# Patient Record
Sex: Female | Born: 1975 | Hispanic: No | Marital: Married | Smoking: Never smoker
Health system: Southern US, Community
[De-identification: ages and names within clinical notes are randomized; demographics above are authoritative.]

## PROBLEM LIST (undated history)

## (undated) DIAGNOSIS — R42 Dizziness and giddiness: Secondary | ICD-10-CM

## (undated) DIAGNOSIS — M7989 Other specified soft tissue disorders: Secondary | ICD-10-CM

## (undated) DIAGNOSIS — K219 Gastro-esophageal reflux disease without esophagitis: Secondary | ICD-10-CM

## (undated) DIAGNOSIS — D649 Anemia, unspecified: Secondary | ICD-10-CM

## (undated) DIAGNOSIS — M25529 Pain in unspecified elbow: Secondary | ICD-10-CM

## (undated) HISTORY — PX: BREAST SURGERY: SHX581

## (undated) HISTORY — PX: WISDOM TOOTH EXTRACTION: SHX21

---

## 2013-07-01 ENCOUNTER — Other Ambulatory Visit: Payer: Self-pay | Admitting: Cardiovascular Disease

## 2013-07-01 ENCOUNTER — Ambulatory Visit
Admission: RE | Admit: 2013-07-01 | Discharge: 2013-07-01 | Disposition: A | Discharge status: Home / Self Care | Payer: No Typology Code available for payment source | Source: Ambulatory Visit | Attending: Cardiovascular Disease | Admitting: Cardiovascular Disease

## 2013-07-01 DIAGNOSIS — J40 Bronchitis, not specified as acute or chronic: Secondary | ICD-10-CM

## 2015-08-20 ENCOUNTER — Encounter (HOSPITAL_COMMUNITY): Payer: Self-pay

## 2015-08-20 ENCOUNTER — Emergency Department (HOSPITAL_COMMUNITY): Payer: Self-pay

## 2015-08-20 ENCOUNTER — Emergency Department (HOSPITAL_COMMUNITY)
Admission: EM | Admit: 2015-08-20 | Discharge: 2015-08-21 | Disposition: A | Discharge status: Home / Self Care | Payer: Self-pay | Attending: Emergency Medicine | Admitting: Emergency Medicine

## 2015-08-20 DIAGNOSIS — Z792 Long term (current) use of antibiotics: Secondary | ICD-10-CM | POA: Insufficient documentation

## 2015-08-20 DIAGNOSIS — N949 Unspecified condition associated with female genital organs and menstrual cycle: Secondary | ICD-10-CM

## 2015-08-20 DIAGNOSIS — Z3202 Encounter for pregnancy test, result negative: Secondary | ICD-10-CM | POA: Insufficient documentation

## 2015-08-20 DIAGNOSIS — N838 Other noninflammatory disorders of ovary, fallopian tube and broad ligament: Secondary | ICD-10-CM | POA: Insufficient documentation

## 2015-08-20 DIAGNOSIS — D259 Leiomyoma of uterus, unspecified: Secondary | ICD-10-CM | POA: Insufficient documentation

## 2015-08-20 LAB — CBC WITH DIFFERENTIAL/PLATELET
Basophils Absolute: 0 10*3/uL (ref 0.0–0.1)
Basophils Relative: 0 %
Eosinophils Absolute: 0.2 10*3/uL (ref 0.0–0.7)
Eosinophils Relative: 2 %
HCT: 35.4 % — ABNORMAL LOW (ref 36.0–46.0)
Hemoglobin: 11.2 g/dL — ABNORMAL LOW (ref 12.0–15.0)
Lymphocytes Relative: 31 %
Lymphs Abs: 3.3 10*3/uL (ref 0.7–4.0)
MCH: 22.1 pg — ABNORMAL LOW (ref 26.0–34.0)
MCHC: 31.6 g/dL (ref 30.0–36.0)
MCV: 70 fL — ABNORMAL LOW (ref 78.0–100.0)
Monocytes Absolute: 0.8 10*3/uL (ref 0.1–1.0)
Monocytes Relative: 7 %
Neutro Abs: 6.4 10*3/uL (ref 1.7–7.7)
Neutrophils Relative %: 60 %
Platelets: 364 10*3/uL (ref 150–400)
RBC: 5.06 MIL/uL (ref 3.87–5.11)
RDW: 14.5 % (ref 11.5–15.5)
WBC: 10.7 10*3/uL — ABNORMAL HIGH (ref 4.0–10.5)

## 2015-08-20 LAB — URINE MICROSCOPIC-ADD ON
BACTERIA UA: NONE SEEN
WBC UA: NONE SEEN WBC/hpf (ref 0–5)

## 2015-08-20 LAB — URINALYSIS, ROUTINE W REFLEX MICROSCOPIC
Bilirubin Urine: NEGATIVE
Glucose, UA: NEGATIVE mg/dL
Ketones, ur: NEGATIVE mg/dL
Leukocytes, UA: NEGATIVE
NITRITE: NEGATIVE
PH: 6 (ref 5.0–8.0)
PROTEIN: NEGATIVE mg/dL
Specific Gravity, Urine: 1.006 (ref 1.005–1.030)

## 2015-08-20 LAB — BASIC METABOLIC PANEL
Anion gap: 9 (ref 5–15)
BUN: 8 mg/dL (ref 6–20)
CO2: 24 mmol/L (ref 22–32)
Calcium: 9.5 mg/dL (ref 8.9–10.3)
Chloride: 104 mmol/L (ref 101–111)
Creatinine, Ser: 0.74 mg/dL (ref 0.44–1.00)
GFR calc Af Amer: >60 mL/min (ref >60)
GFR calc non Af Amer: >60 mL/min (ref >60)
Glucose, Bld: 92 mg/dL (ref 65–99)
Potassium: 3.9 mmol/L (ref 3.5–5.1)
Sodium: 137 mmol/L (ref 135–145)

## 2015-08-20 LAB — POC URINE PREG, ED: PREG TEST UR: NEGATIVE

## 2015-08-20 MED ORDER — IOPAMIDOL (ISOVUE-300) INJECTION 61%
100.0000 mL | Freq: Once | INTRAVENOUS | Status: AC | PRN
  Administered 2015-08-20: 100 mL via INTRAVENOUS

## 2015-08-20 MED ORDER — IOHEXOL 300 MG/ML  SOLN
25.0000 mL | Freq: Once | INTRAMUSCULAR | Status: AC | PRN
  Administered 2015-08-20: 25 mL via ORAL

## 2015-08-20 NOTE — ED Notes (Signed)
Pt complains of burning with urination

## 2015-08-20 NOTE — ED Provider Notes (Signed)
CSN: [id number]     Arrival date & time 08/20/15  2019 History   First MD Initiated Contact with Patient 08/20/15 2155     Chief Complaint  Patient presents with  . Abdominal Pain    HPI   40 year old female presents today with abdominal pain. Patient reports several months of increasing abdominal girth, abdominal discomfort located in her lower abdomen and pelvis. She reports over the last 3 weeks she had increasing pain in the pelvis and bladder. Patient was seen by urgent care 2 days ago, was given a prescription of Bactrim for presumed urinary tract infection despite not having any infection present on exam. TIMI persist after 2 days of antibiotics. She describes the pain as sharp with pressure located below the umbilicus down into the pelvis. She denies any vaginal bleeding or discharge; notes irregular cycles most recently on February 25. She denies any fever, chills, nausea, vomiting, upper abdominal pain, changes in bowel habits. Patient notes that she has had increasing urinary frequency, reporting she is unable to hold urine or produce large amounts each time.     History reviewed. No pertinent past medical history. History reviewed. No pertinent past surgical history. History reviewed. No pertinent family history. Social History  Substance Use Topics  . Smoking status: Never Smoker   . Smokeless tobacco: None  . Alcohol Use: No   OB History    No data available     Review of Systems  All other systems reviewed and are negative.   Allergies  Review of patient's allergies indicates no known allergies.  Home Medications   Prior to Admission medications   Medication Sig Start Date End Date Taking? Authorizing Provider  HYDROcodone-acetaminophen (NORCO/VICODIN) 5-325 MG tablet Take 2 tablets by mouth every 4 (four) hours as needed. 08/21/15   Okey Regal, PA-C  sulfamethoxazole-trimethoprim (BACTRIM DS,SEPTRA DS) 800-160 MG tablet Take 1 tablet by mouth 2 (two) times  daily. ABT Start Date 08/18/15 & End Date 08/24/15.   Yes Historical Provider, MD  traMADol (ULTRAM) 50 MG tablet Take 1 tablet (50 mg total) by mouth every 6 (six) hours as needed. 08/21/15   Okey Regal, PA-C   BP 123/87 mmHg  Pulse 65  Temp(Src) 97.7 F (36.5 C) (Oral)  Resp 20  SpO2 100%  LMP 07/17/2015 Physical Exam  Constitutional: She is oriented to person, place, and time. She appears well-developed and well-nourished.  HENT:  Head: Normocephalic and atraumatic.  Eyes: Conjunctivae are normal. Pupils are equal, round, and reactive to light. Right eye exhibits no discharge. Left eye exhibits no discharge. No scleral icterus.  Neck: Normal range of motion. No JVD present. No tracheal deviation present.  Pulmonary/Chest: Effort normal. No stridor.  Abdominal:  Lower abdominal distention noted, significant tenderness to palpation of the lower abdomen and pelvis bilateral  Genitourinary: Vagina normal. No erythema or bleeding in the vagina. No vaginal discharge found.  Gravid uterus with tenderness to palpation  Neurological: She is alert and oriented to person, place, and time. Coordination normal.  Psychiatric: She has a normal mood and affect. Her behavior is normal. Judgment and thought content normal.  Nursing note and vitals reviewed.   ED Course  Procedures (including critical care time) Labs Review Labs Reviewed  WET PREP, GENITAL - Abnormal; Notable for the following:    WBC, Wet Prep HPF POC FEW (*)    All other components within normal limits  URINALYSIS, ROUTINE W REFLEX MICROSCOPIC (NOT AT [facility]) - Abnormal; Notable for the following:  Hgb urine dipstick MODERATE (*)    All other components within normal limits  URINE MICROSCOPIC-ADD ON - Abnormal; Notable for the following:    Squamous Epithelial / LPF 0-5 (*)    All other components within normal limits  CBC WITH DIFFERENTIAL/PLATELET - Abnormal; Notable for the following:    WBC 10.7 (*)    Hemoglobin 11.2  (*)    HCT 35.4 (*)    MCV 70.0 (*)    MCH 22.1 (*)    All other components within normal limits  BASIC METABOLIC PANEL  POC URINE PREG, ED  GC/CHLAMYDIA PROBE AMP ([place]) NOT AT [facility]    Imaging Review Ct Abdomen Pelvis W Contrast  08/21/2015  CLINICAL DATA:  Acute onset of severe lower abdominal pain. Initial encounter. EXAM: CT ABDOMEN AND PELVIS WITH CONTRAST TECHNIQUE: Multidetector CT imaging of the abdomen and pelvis was performed using the standard protocol following bolus administration of intravenous contrast. CONTRAST:  182mL ISOVUE-300 IOPAMIDOL (ISOVUE-300) INJECTION 61% COMPARISON:  None. FINDINGS: The visualized lung bases are clear. The liver and spleen are unremarkable in appearance. The gallbladder is within normal limits. The pancreas and adrenal glands are unremarkable. The kidneys are unremarkable in appearance. There is no evidence of hydronephrosis. No renal or ureteral stones are seen. No perinephric stranding is appreciated. No free fluid is identified. The small bowel is unremarkable in appearance. The stomach is within normal limits. No acute vascular abnormalities are seen. The appendix is normal in caliber, without evidence of appendicitis. The colon is unremarkable in appearance. The bladder is mildly distended and grossly unremarkable. There is a very large 12.1 x 11.4 x 9.5 cm mass noted posteriorly at the lower uterine segment. This is mildly heterogeneous in appearance, without significant associated endometrial fluid. Given its size and the lack of significant additional findings, this most likely reflects a large exophytic fibroid, though a cervical mass cannot be entirely excluded given the presence of an 8 mm node posteriorly adjacent to the distal sigmoid colon. A 3.9 cm left adnexal cyst is likely physiologic in nature, though pelvic ultrasound could be considered for further evaluation. The right ovary is not well seen. Scattered nabothian cysts are noted at  the cervix. No inguinal lymphadenopathy is seen. No acute osseous abnormalities are identified. IMPRESSION: 1. Very large 12.1 x 11.4 x 9.5 cm mass noted posteriorly at the lower uterine segment. No significant associated endometrial fluid seen. Given its size and the lack of significant additional findings, this most likely reflects a large exophytic fibroid. It may correspond to the patient's symptoms. A cervical mass cannot be entirely excluded, given the presence of an 8 mm node posteriorly adjacent to the distal sigmoid colon. Would correlate with Pap smear and lab results, to help exclude malignancy. 2. 3.9 cm left adnexal cyst is likely physiologic, though pelvic ultrasound could be considered for further evaluation, when and as deemed clinically appropriate. Electronically Signed   By: Garald Balding M.D.   On: 08/21/2015 00:14   I have personally reviewed and evaluated these images and lab results as part of my medical decision-making.   EKG Interpretation None      MDM   Final diagnoses:  Uterine leiomyoma, unspecified location  Adnexal cyst    Labs: Wet prep, CBC, CMP, point-of-care break, urinalysis- no significant findings  Imaging: CT abdomen and pelvis  Consults:  Therapeutics: Hydrocodone  Discharge Meds:   Assessment/Plan: 40 year old female presents today with uterine fibroid, this is significant in size likely source  for patient's discomfort. Patient also noted to have a left adnexal cyst which is likely physiologic. Patient has no infectious signs or symptoms on exam, low suspicion for any pelvic inflammatory disease, infectious etiology. Patient will be given pain medication, encouraged to follow up closely with the [facility] and OB/GYN for further evaluation and management. She is instructed to make contact first thing Monday morning and schedule first available follow-up visit. If any new or worsening signs or symptoms present patient is instructed to return  immediately to the emergency room for further evaluation and management. The patient and husband verbalized understanding and agreement to today's plan had no further questions or concerns at time of discharge          Okey Regal, PA-C 08/21/15 [place], DO 08/21/15 2207

## 2015-08-20 NOTE — ED Notes (Signed)
Pt complains of severe lower abd pain that started several days ago, she received an antibiotic for a UTI on the 29th and isn't getting any relief, no vomiting or diarrhea, she states she is having a hard time holding her urine

## 2015-08-20 NOTE — ED Notes (Signed)
Patient transported to CT 

## 2015-08-21 LAB — WET PREP, GENITAL
Clue Cells Wet Prep HPF POC: NONE SEEN
Sperm: NONE SEEN
Trich, Wet Prep: NONE SEEN
Yeast Wet Prep HPF POC: NONE SEEN

## 2015-08-21 MED ORDER — HYDROCODONE-ACETAMINOPHEN 5-325 MG PO TABS: 2.0000 | ORAL_TABLET | ORAL | Status: DC | PRN

## 2015-08-21 MED ORDER — HYDROCODONE-ACETAMINOPHEN 5-325 MG PO TABS
1.0000 | ORAL_TABLET | Freq: Once | ORAL | Status: AC
  Administered 2015-08-21: 1 via ORAL
  Filled 2015-08-21: qty 1

## 2015-08-21 MED ORDER — TRAMADOL HCL 50 MG PO TABS: 50.0000 mg | ORAL_TABLET | Freq: Four times a day (QID) | ORAL | Status: DC | PRN

## 2015-08-21 NOTE — Discharge Instructions (Signed)
Please follow-up with OB/GYN first thing Monday morning and schedule follow-up evaluation. If any new or worsening signs or symptoms present please return immediately for further evaluation. Please use pain medication only as needed for pain. Ultram may be used for nonsevere abdominal discomfort, if pain is not relieved with Ultram, hydrocodone may be used.

## 2015-08-23 LAB — GC/CHLAMYDIA PROBE AMP (~~LOC~~) NOT AT ARMC
Chlamydia: NEGATIVE
Neisseria Gonorrhea: NEGATIVE

## 2015-08-26 ENCOUNTER — Inpatient Hospital Stay (HOSPITAL_COMMUNITY)
Admission: AD | Admit: 2015-08-26 | Discharge: 2015-08-26 | Disposition: A | Discharge status: Home / Self Care | Payer: Self-pay | Source: Ambulatory Visit | Attending: Obstetrics & Gynecology | Admitting: Obstetrics & Gynecology

## 2015-08-26 ENCOUNTER — Inpatient Hospital Stay (HOSPITAL_COMMUNITY): Payer: Self-pay

## 2015-08-26 ENCOUNTER — Encounter (HOSPITAL_COMMUNITY): Payer: Self-pay

## 2015-08-26 DIAGNOSIS — R1900 Intra-abdominal and pelvic swelling, mass and lump, unspecified site: Secondary | ICD-10-CM | POA: Insufficient documentation

## 2015-08-26 DIAGNOSIS — R109 Unspecified abdominal pain: Secondary | ICD-10-CM

## 2015-08-26 DIAGNOSIS — K5900 Constipation, unspecified: Secondary | ICD-10-CM | POA: Insufficient documentation

## 2015-08-26 DIAGNOSIS — R350 Frequency of micturition: Secondary | ICD-10-CM | POA: Insufficient documentation

## 2015-08-26 LAB — URINALYSIS, ROUTINE W REFLEX MICROSCOPIC
Bilirubin Urine: NEGATIVE
Glucose, UA: NEGATIVE mg/dL
HGB URINE DIPSTICK: NEGATIVE
Ketones, ur: NEGATIVE mg/dL
Leukocytes, UA: NEGATIVE
Nitrite: NEGATIVE
PROTEIN: NEGATIVE mg/dL
SPECIFIC GRAVITY, URINE: 1.01 (ref 1.005–1.030)
pH: 5 (ref 5.0–8.0)

## 2015-08-26 LAB — POCT PREGNANCY, URINE: Preg Test, Ur: NEGATIVE

## 2015-08-26 MED ORDER — DOCUSATE SODIUM 100 MG PO CAPS: 100.0000 mg | ORAL_CAPSULE | Freq: Two times a day (BID) | ORAL | Status: DC

## 2015-08-26 MED ORDER — IBUPROFEN 800 MG PO TABS: 800.0000 mg | ORAL_TABLET | Freq: Three times a day (TID) | ORAL | Status: DC

## 2015-08-26 MED ORDER — OXYCODONE-ACETAMINOPHEN 5-325 MG PO TABS: 2.0000 | ORAL_TABLET | ORAL | Status: DC | PRN

## 2015-08-26 MED ORDER — KETOROLAC TROMETHAMINE 60 MG/2ML IM SOLN
60.0000 mg | Freq: Once | INTRAMUSCULAR | Status: AC
  Administered 2015-08-26: 60 mg via INTRAMUSCULAR
  Filled 2015-08-26: qty 2

## 2015-08-26 NOTE — MAU Provider Note (Signed)
History     CSN: [id number]  Arrival date and time: 08/26/15 1430   First Provider Initiated Contact with Patient 08/26/15 1828      Chief Complaint  Patient presents with  . Abdominal Pain   HPI Pt is 58 AA G3P0012 not pregnant who presents with abd pain with known pelvic mass on CT.  Pt has an appointment scheduled in the clinic on 4/17 but has had increase in abd pain and urinary pressure/frequency as well as constipation.  Pt is taking Norco and Tramadal  For the pain, but pt is not getting relief from the pain. Pt states she has had RCM with LMP 07/17/2015.  Pt states her abdominal pain started 3 weeks ago. RN note: Pt c/o abd pain. Was seen at [facility] and Told she had a cyst that had to be evaluated at [facility] she has a scheduled appointment in the clinic on 4/17. Still having pain despite taking pain medication as prescribed.C/o urinary frequency and burning as well as abd pain  History reviewed. No pertinent past medical history.  History reviewed. No pertinent past surgical history.  History reviewed. No pertinent family history.  Social History  Substance Use Topics  . Smoking status: Never Smoker   . Smokeless tobacco: None  . Alcohol Use: No    Allergies: No Known Allergies  Prescriptions prior to admission  Medication Sig Dispense Refill Last Dose  . HYDROcodone-acetaminophen (NORCO/VICODIN) 5-325 MG tablet Take 2 tablets by mouth every 4 (four) hours as needed. 15 tablet 0   . sulfamethoxazole-trimethoprim (BACTRIM DS,SEPTRA DS) 800-160 MG tablet Take 1 tablet by mouth 2 (two) times daily. ABT Start Date 08/18/15 & End Date 08/24/15.   08/20/2015 at Unknown time  . traMADol (ULTRAM) 50 MG tablet Take 1 tablet (50 mg total) by mouth every 6 (six) hours as needed. 15 tablet 0     Review of Systems  Constitutional: Negative for fever and chills.  Gastrointestinal: Positive for abdominal pain and constipation. Negative for nausea and vomiting.  Genitourinary: Positive  for frequency.   Physical Exam   Blood pressure 140/91, pulse 70, temperature 98.2 F (36.8 C), resp. rate 18, weight 157 lb 9.6 oz (71.487 kg), last menstrual period 07/17/2015.  Physical Exam  Nursing note and vitals reviewed. Constitutional: She appears well-developed and well-nourished. No distress.  Eyes: Pupils are equal, round, and reactive to light.  Neck: Normal range of motion. Neck supple.  Cardiovascular: Normal rate.   Respiratory: Effort normal. No respiratory distress.  GI: Soft. She exhibits no distension. There is tenderness. There is no rebound and no guarding.  Genitourinary:  Pelvic deferred  Musculoskeletal: Normal range of motion.  Neurological: She is alert.  Skin: Skin is warm and dry.  Psychiatric: She has a normal mood and affect.    MAU Course  Procedures Results for orders placed or performed during the hospital encounter of 08/26/15 (from the past 24 hour(s))  Urinalysis, Routine w reflex microscopic (not at [facility])     Status: None   Collection Time: 08/26/15  1:05 PM  Result Value Ref Range   Color, Urine YELLOW YELLOW   APPearance CLEAR CLEAR   Specific Gravity, Urine 1.010 1.005 - 1.030   pH 5.0 5.0 - 8.0   Glucose, UA NEGATIVE NEGATIVE mg/dL   Hgb urine dipstick NEGATIVE NEGATIVE   Bilirubin Urine NEGATIVE NEGATIVE   Ketones, ur NEGATIVE NEGATIVE mg/dL   Protein, ur NEGATIVE NEGATIVE mg/dL   Nitrite NEGATIVE NEGATIVE   Leukocytes, UA  NEGATIVE NEGATIVE  Pregnancy, urine POC     Status: None   Collection Time: 08/26/15  3:24 PM  Result Value Ref Range   Preg Test, Ur NEGATIVE NEGATIVE  discussed with Dr. Ihor Dow- will do [place] then f/u in clinic as scheduled  Toradol 60mg  IM given for pain with relief US Transvaginal Non-ob  08/26/2015  CLINICAL DATA:  Abdominal pain for 3 weeks, CT scan by report showed large pelvic mass EXAM: TRANSABDOMINAL AND TRANSVAGINAL ULTRASOUND OF PELVIS TECHNIQUE: Both transabdominal and transvaginal ultrasound  examinations of the pelvis were performed. Transabdominal technique was performed for global imaging of the pelvis including uterus, ovaries, adnexal regions, and pelvic cul-de-sac. It was necessary to proceed with endovaginal exam following the transabdominal exam to visualize the ovaries. COMPARISON:  CT scan is currently unavailable for comparison. FINDINGS: Uterus Measurements: 11 x 4 x 6 cm. Suboptimal visualization but no focal abnormalities seen. Endometrium Thickness: 11 mm.  No focal abnormality visualized. Right ovary Not identified Left ovary Measurements: 56 x 48 x 39 mm. There is a 53 x 38 x 38 mm simple cyst involving the left ovary. Other findings There is a solid mass posterior to the uterus measuring 11 x 8 x 12 cm. It demonstrates internal vascularity. There is no free fluid. IMPRESSION: 5 cm simple cyst left ovary. Additionally, there is a large solid mass posterior to the uterus. It is uncertain if this involves right ovary, which is not visualized, and as such would be concerning for malignancy. An extremely large pedunculated leiomyoma or leiomyosarcoma is a consideration although the mass does not clearly appear to arise from the uterus. Surgical consultation recommended. Pelvic MRI could be considered to characterize further. Electronically Signed   By: Skipper Cliche M.D.   On: 08/26/2015 19:55   US Pelvis Complete  08/26/2015  CLINICAL DATA:  Abdominal pain for 3 weeks, CT scan by report showed large pelvic mass EXAM: TRANSABDOMINAL AND TRANSVAGINAL ULTRASOUND OF PELVIS TECHNIQUE: Both transabdominal and transvaginal ultrasound examinations of the pelvis were performed. Transabdominal technique was performed for global imaging of the pelvis including uterus, ovaries, adnexal regions, and pelvic cul-de-sac. It was necessary to proceed with endovaginal exam following the transabdominal exam to visualize the ovaries. COMPARISON:  CT scan is currently unavailable for comparison. FINDINGS:  Uterus Measurements: 11 x 4 x 6 cm. Suboptimal visualization but no focal abnormalities seen. Endometrium Thickness: 11 mm.  No focal abnormality visualized. Right ovary Not identified Left ovary Measurements: 56 x 48 x 39 mm. There is a 53 x 38 x 38 mm simple cyst involving the left ovary. Other findings There is a solid mass posterior to the uterus measuring 11 x 8 x 12 cm. It demonstrates internal vascularity. There is no free fluid. IMPRESSION: 5 cm simple cyst left ovary. Additionally, there is a large solid mass posterior to the uterus. It is uncertain if this involves right ovary, which is not visualized, and as such would be concerning for malignancy. An extremely large pedunculated leiomyoma or leiomyosarcoma is a consideration although the mass does not clearly appear to arise from the uterus. Surgical consultation recommended. Pelvic MRI could be considered to characterize further. Electronically Signed   By: Skipper Cliche M.D.   On: 08/26/2015 19:55   Assessment and Plan  Pelvic mass Rx Percocet; colace; Iburprofen 800mg  F/u in clinic as scheduled on 4/17   [facility] 08/26/2015, 6:29 PM

## 2015-08-26 NOTE — MAU Note (Addendum)
Pt c/o abd pain. Was seen at [facility] and  Told she had a cyst that had to be evaluated at [facility] she has a scheduled appointment in the clinic on 4/17. Still having pain despite taking pain medication as prescribed.C/o urinary frequency and burning as well as abd pain

## 2015-09-06 ENCOUNTER — Ambulatory Visit (INDEPENDENT_AMBULATORY_CARE_PROVIDER_SITE_OTHER): Payer: Self-pay | Admitting: Obstetrics and Gynecology

## 2015-09-06 ENCOUNTER — Other Ambulatory Visit: Payer: Self-pay | Admitting: Obstetrics and Gynecology

## 2015-09-06 ENCOUNTER — Encounter: Payer: Self-pay | Admitting: Obstetrics and Gynecology

## 2015-09-06 VITALS — BP 132/86 | HR 68 | Wt 156.2 lb

## 2015-09-06 DIAGNOSIS — N390 Urinary tract infection, site not specified: Secondary | ICD-10-CM

## 2015-09-06 DIAGNOSIS — D259 Leiomyoma of uterus, unspecified: Secondary | ICD-10-CM

## 2015-09-06 LAB — POCT URINALYSIS DIP (DEVICE)
BILIRUBIN URINE: NEGATIVE
Glucose, UA: NEGATIVE mg/dL
KETONES UR: NEGATIVE mg/dL
LEUKOCYTES UA: NEGATIVE
Nitrite: NEGATIVE
Protein, ur: NEGATIVE mg/dL
Urobilinogen, UA: 0.2 mg/dL (ref 0.0–1.0)
pH: 5.5 (ref 5.0–8.0)

## 2015-09-06 NOTE — Progress Notes (Signed)
[place] # [phone] Interventional Radiology appt scheduled for April 20th @ 1445 @ [place].  Pt notified.

## 2015-09-06 NOTE — Progress Notes (Addendum)
Patient ID: Felicia Roman, female   DOB: 02-02-1976, 40 y.o.   MRN: [id number] 40 yo G3P2012 presenting today for the evaluation of pelvic pain. Patient was seen in the ED in March with a similar complaints and was diagnosed with a fibroid uterus and ovarian cyst. Patient states that she has experienced persistent pelvic pain and pressure associated with constipation and urinary frequency. She feels that she is not able to completely empty her bladder. All of these symptoms have developed over the past month. Her pain is managed with ibuprofen and prescribed narcotics  No past medical history on file. No past surgical history on file. No family history on file. Social History  Substance Use Topics  . Smoking status: Never Smoker   . Smokeless tobacco: None  . Alcohol Use: No   ROS See pertinent in HPI Blood pressure 132/86, pulse 68, weight 156 lb 3.2 oz (70.852 kg), last menstrual period 09/01/2015.  GENERAL: Well-developed, well-nourished female in no acute distress.  HEENT: Normocephalic, atraumatic. Sclerae anicteric.  NECK: Supple. Normal thyroid.  LUNGS: Clear to auscultation bilaterally.  HEART: Regular rate and rhythm. BREASTS: Symmetric in size. No palpable masses or lymphadenopathy, skin changes, or nipple drainage. ABDOMEN: Soft, nontender, nondistended. No organomegaly. PELVIC: Normal external female genitalia. Vagina is pink and rugated.  Normal discharge. Normal appearing cervix. Uterus is retroverted with limited mobility. Approximately 12 weeks in size with fullness in posterior cul de sac. No adnexal mass or tenderness. EXTREMITIES: No cyanosis, clubbing, or edema, 2+ distal pulses.   08/21/2015 CT scan IMPRESSION: 1. Very large 12.1 x 11.4 x 9.5 cm mass noted posteriorly at the lower uterine segment. No significant associated endometrial fluid seen. Given its size and the lack of significant additional findings, this most likely reflects a large exophytic fibroid.  It may correspond to the patient's symptoms. A cervical mass cannot be entirely excluded, given the presence of an 8 mm node posteriorly adjacent to the distal sigmoid colon. Would correlate with Pap smear and lab results, to help exclude malignancy. 2. 3.9 cm left adnexal cyst is likely physiologic, though pelvic ultrasound could be considered for further evaluation, when and as deemed clinically appropriate.   08/26/2015 Ultrasound FINDINGS: Uterus  Measurements: 11 x 4 x 6 cm. Suboptimal visualization but no focal abnormalities seen.  Endometrium  Thickness: 11 mm. No focal abnormality visualized.  Right ovary  Not identified  Left ovary  Measurements: 56 x 48 x 39 mm. There is a 53 x 38 x 38 mm simple cyst involving the left ovary.  Other findings  There is a solid mass posterior to the uterus measuring 11 x 8 x 12 cm. It demonstrates internal vascularity. There is no free fluid.  IMPRESSION: 5 cm simple cyst left ovary.  Additionally, there is a large solid mass posterior to the uterus. It is uncertain if this involves right ovary, which is not visualized, and as such would be concerning for malignancy. An extremely large pedunculated leiomyoma or leiomyosarcoma is a consideration although the mass does not clearly appear to arise from the uterus.  Surgical consultation recommended. Pelvic MRI could be considered to characterize further.   Electronically Signed  By: Skipper Cliche M.D.  On: 08/26/2015 19:55  A/P 40 yo with symptomatic fibroid uterus - Discussed surgical management with [place] and hysterectomy. Risks benefits and alternatives were reviewed and explained to the patient including but not limited to risks of bleeding, infection and damage to adjacent organs. Patient verbalized understanding. She discussed her  options with her husband. She decided to proceed with [place] in order to avoid major surgery - pap smear not done today as  patient will schedule an appointment with free cervical cancer screening session - Mammogram also ordered - Patient advised to try to control her pain with ibuprofen in order to decrease the constipation associated with the use of narcotics - RTC prn

## 2015-09-09 ENCOUNTER — Other Ambulatory Visit: Payer: Self-pay | Admitting: Obstetrics and Gynecology

## 2015-09-09 ENCOUNTER — Ambulatory Visit
Admission: RE | Admit: 2015-09-09 | Discharge: 2015-09-09 | Disposition: A | Discharge status: Home / Self Care | Payer: No Typology Code available for payment source | Source: Ambulatory Visit | Attending: Obstetrics and Gynecology | Admitting: Obstetrics and Gynecology

## 2015-09-09 DIAGNOSIS — D259 Leiomyoma of uterus, unspecified: Secondary | ICD-10-CM

## 2015-09-09 NOTE — Consult Note (Signed)
Chief Complaint: Symptomatic uterine fibroids  Referring Physician(s): Constant,Peggy  History of Present Illness: Felicia Roman is a 40 y.o. (g4, p2) female with no significant past medical history who was found to have a large (at least 12.1 cm) pelvic mass on abdominal CT performed 08/20/2015 for the workup of lower abdominal pain. The patient subsequently underwent a pelvic ultrasound on 08/26/2015 which confirmed the presence of the large pelvic mass, though the mass again remains indeterminate on both of these imaging examinations. As the pelvic mass was favored to represent a fibroid, the patient was initially seen by Dr. Elly Modena for evaluation of hysterectomy. Following this discussion, the patient wished to pursue alternative treatment and as such was referred to interventional radiology clinic for potential percutaneous management. She is accompanied by her husband who serves as her Optometrist.  The patient reports a long history of (greater than 10 years) of menorrhagia. She also reports pelvic pain which is worse at the time of her cycle. She also admits to bulk symptoms including abdominal pain, pressure, constipation, abdominal bloating, back pain and urinary frequency. All these symptoms have been present for some time and despite pointed questioning, do not appear to have acutely worsened recently.  The patient states that her cycles are regular however when they occur last treatment approximately 3-4 days which is associated with marked menorrhagia including the passing of blood clots and necessitating the changing of pads every 1-2 hours.  The patient has never had a blood transfusion.  The patient has not had a recent Pap smear or endometrial biopsy.  No unintentional weight loss or weight gain. No fever or chills.  No past medical history on file.  No past surgical history on file.  Allergies: Review of patient's allergies indicates no known  allergies.  Medications: Prior to Admission medications   Medication Sig Start Date End Date Taking? Authorizing Provider  HYDROcodone-acetaminophen (NORCO/VICODIN) 5-325 MG tablet Take 2 tablets by mouth every 4 (four) hours as needed. 08/21/15  Yes Okey Regal, PA-C  NON FORMULARY Herbal supplement for constipation   Yes Historical Provider, MD  docusate sodium (COLACE) 100 MG capsule Take 1 capsule (100 mg total) by mouth every 12 (twelve) hours. Patient not taking: Reported on 09/06/2015 08/26/15   West Pugh, NP  ibuprofen (ADVIL,MOTRIN) 800 MG tablet Take 1 tablet (800 mg total) by mouth 3 (three) times daily. Patient not taking: Reported on 09/09/2015 08/26/15   West Pugh, NP  oxyCODONE-acetaminophen (PERCOCET/ROXICET) 5-325 MG tablet Take 2 tablets by mouth every 4 (four) hours as needed for severe pain. Patient not taking: Reported on 09/06/2015 08/26/15   West Pugh, NP  traMADol (ULTRAM) 50 MG tablet Take 1 tablet (50 mg total) by mouth every 6 (six) hours as needed. Patient not taking: Reported on 09/06/2015 08/21/15   Okey Regal, PA-C     No family history on file.  Social History   Social History  . Marital Status: Married    Spouse Name: N/A  . Number of Children: N/A  . Years of Education: N/A   Social History Main Topics  . Smoking status: Never Smoker   . Smokeless tobacco: Not on file  . Alcohol Use: No  . Drug Use: Not on file  . Sexual Activity: Not on file   Other Topics Concern  . Not on file   Social History Narrative    ECOG Status: 1 - Symptomatic but completely ambulatory  Review of  Systems: A 12 point ROS discussed and pertinent positives are indicated in the HPI above.  All other systems are negative.  Review of Systems  Constitutional: Negative for fever, activity change, appetite change and fatigue.  Respiratory: Negative.   Cardiovascular: Negative.   Gastrointestinal: Positive for abdominal pain and constipation.  Negative for blood in stool.  Genitourinary: Positive for urgency, frequency, menstrual problem and pelvic pain. Negative for dysuria, flank pain, vaginal discharge and difficulty urinating.  Musculoskeletal: Positive for back pain.    Vital Signs: BP 140/89 mmHg  Pulse 74  Temp(Src) 97.6 F (36.4 C) (Oral)  Resp 14  Ht 5\' 5"  (1.651 m)  Wt 156 lb (70.761 kg)  BMI 25.96 kg/m2  SpO2 100%  LMP 09/01/2015 (Exact Date)  Physical Exam  Constitutional: She appears well-developed and well-nourished.  HENT:  Head: Normocephalic and atraumatic.  Cardiovascular: Normal rate, regular rhythm and intact distal pulses.   Easily palpable right common femoral and dorsalis pedis artery pulses.  Abdominal: Soft. Bowel sounds are normal. There is tenderness.  Patient is tender with palpation of the right lower abdominal quadrant.  I am unable to palpate the uterine fundus.  Psychiatric: She has a normal mood and affect. Her behavior is normal.  Nursing note and vitals reviewed.   Imaging:  Selected images from patient's pelvic ultrasound performed 08/26/2015 as well as CT scan of the abdomen pelvis performed 08/20/2015 were reviewed in detail with the patient and the patient's husband.  US Transvaginal Non-ob  08/26/2015  CLINICAL DATA:  Abdominal pain for 3 weeks, CT scan by report showed large pelvic mass EXAM: TRANSABDOMINAL AND TRANSVAGINAL ULTRASOUND OF PELVIS TECHNIQUE: Both transabdominal and transvaginal ultrasound examinations of the pelvis were performed. Transabdominal technique was performed for global imaging of the pelvis including uterus, ovaries, adnexal regions, and pelvic cul-de-sac. It was necessary to proceed with endovaginal exam following the transabdominal exam to visualize the ovaries. COMPARISON:  CT scan is currently unavailable for comparison. FINDINGS: Uterus Measurements: 11 x 4 x 6 cm. Suboptimal visualization but no focal abnormalities seen. Endometrium Thickness: 11 mm.   No focal abnormality visualized. Right ovary Not identified Left ovary Measurements: 56 x 48 x 39 mm. There is a 53 x 38 x 38 mm simple cyst involving the left ovary. Other findings There is a solid mass posterior to the uterus measuring 11 x 8 x 12 cm. It demonstrates internal vascularity. There is no free fluid. IMPRESSION: 5 cm simple cyst left ovary. Additionally, there is a large solid mass posterior to the uterus. It is uncertain if this involves right ovary, which is not visualized, and as such would be concerning for malignancy. An extremely large pedunculated leiomyoma or leiomyosarcoma is a consideration although the mass does not clearly appear to arise from the uterus. Surgical consultation recommended. Pelvic MRI could be considered to characterize further. Electronically Signed   By: Skipper Cliche M.D.   On: 08/26/2015 19:55   US Pelvis Complete  08/26/2015  CLINICAL DATA:  Abdominal pain for 3 weeks, CT scan by report showed large pelvic mass EXAM: TRANSABDOMINAL AND TRANSVAGINAL ULTRASOUND OF PELVIS TECHNIQUE: Both transabdominal and transvaginal ultrasound examinations of the pelvis were performed. Transabdominal technique was performed for global imaging of the pelvis including uterus, ovaries, adnexal regions, and pelvic cul-de-sac. It was necessary to proceed with endovaginal exam following the transabdominal exam to visualize the ovaries. COMPARISON:  CT scan is currently unavailable for comparison. FINDINGS: Uterus Measurements: 11 x 4 x 6 cm. Suboptimal  visualization but no focal abnormalities seen. Endometrium Thickness: 11 mm.  No focal abnormality visualized. Right ovary Not identified Left ovary Measurements: 56 x 48 x 39 mm. There is a 53 x 38 x 38 mm simple cyst involving the left ovary. Other findings There is a solid mass posterior to the uterus measuring 11 x 8 x 12 cm. It demonstrates internal vascularity. There is no free fluid. IMPRESSION: 5 cm simple cyst left ovary.  Additionally, there is a large solid mass posterior to the uterus. It is uncertain if this involves right ovary, which is not visualized, and as such would be concerning for malignancy. An extremely large pedunculated leiomyoma or leiomyosarcoma is a consideration although the mass does not clearly appear to arise from the uterus. Surgical consultation recommended. Pelvic MRI could be considered to characterize further. Electronically Signed   By: Skipper Cliche M.D.   On: 08/26/2015 19:55   Ct Abdomen Pelvis W Contrast  08/21/2015  CLINICAL DATA:  Acute onset of severe lower abdominal pain. Initial encounter. EXAM: CT ABDOMEN AND PELVIS WITH CONTRAST TECHNIQUE: Multidetector CT imaging of the abdomen and pelvis was performed using the standard protocol following bolus administration of intravenous contrast. CONTRAST:  159mL ISOVUE-300 IOPAMIDOL (ISOVUE-300) INJECTION 61% COMPARISON:  None. FINDINGS: The visualized lung bases are clear. The liver and spleen are unremarkable in appearance. The gallbladder is within normal limits. The pancreas and adrenal glands are unremarkable. The kidneys are unremarkable in appearance. There is no evidence of hydronephrosis. No renal or ureteral stones are seen. No perinephric stranding is appreciated. No free fluid is identified. The small bowel is unremarkable in appearance. The stomach is within normal limits. No acute vascular abnormalities are seen. The appendix is normal in caliber, without evidence of appendicitis. The colon is unremarkable in appearance. The bladder is mildly distended and grossly unremarkable. There is a very large 12.1 x 11.4 x 9.5 cm mass noted posteriorly at the lower uterine segment. This is mildly heterogeneous in appearance, without significant associated endometrial fluid. Given its size and the lack of significant additional findings, this most likely reflects a large exophytic fibroid, though a cervical mass cannot be entirely excluded given  the presence of an 8 mm node posteriorly adjacent to the distal sigmoid colon. A 3.9 cm left adnexal cyst is likely physiologic in nature, though pelvic ultrasound could be considered for further evaluation. The right ovary is not well seen. Scattered nabothian cysts are noted at the cervix. No inguinal lymphadenopathy is seen. No acute osseous abnormalities are identified. IMPRESSION: 1. Very large 12.1 x 11.4 x 9.5 cm mass noted posteriorly at the lower uterine segment. No significant associated endometrial fluid seen. Given its size and the lack of significant additional findings, this most likely reflects a large exophytic fibroid. It may correspond to the patient's symptoms. A cervical mass cannot be entirely excluded, given the presence of an 8 mm node posteriorly adjacent to the distal sigmoid colon. Would correlate with Pap smear and lab results, to help exclude malignancy. 2. 3.9 cm left adnexal cyst is likely physiologic, though pelvic ultrasound could be considered for further evaluation, when and as deemed clinically appropriate. Electronically Signed   By: Garald Balding M.D.   On: 08/21/2015 00:14    Labs:  CBC:  Recent Labs  08/20/15 2225  WBC 10.7*  HGB 11.2*  HCT 35.4*  PLT 364    COAGS: No results for input(s): INR, APTT in the last 8760 hours.  BMP:  Recent Labs  08/20/15 2225  NA 137  K 3.9  CL 104  CO2 24  GLUCOSE 92  BUN 8  CALCIUM 9.5  CREATININE 0.74  GFRNONAA >60  GFRAA >60    LIVER FUNCTION TESTS: No results for input(s): BILITOT, AST, ALT, ALKPHOS, PROT, ALBUMIN in the last 8760 hours.  TUMOR MARKERS: No results for input(s): AFPTM, CEA, CA199, CHROMGRNA in the last 8760 hours.  Assessment and Plan:  Felicia Roman is a 40 y.o. (g4, p2) female with no significant past medical history who was found to have a large (at least 12.1 cm) pelvic mass on abdominal CT performed 08/20/2015 for the workup of lower abdominal pain. The patient subsequently  underwent a pelvic ultrasound on 08/26/2015 which confirmed the presence of the large pelvic mass, though the mass again remains indeterminate on both of these imaging examinations.   Patient has multiple potentially fibroid related complaints including chronic menorrhagia, abdominal pain, constipation, bloating, back pain and urinary frequency.  Selected images from patient's pelvic ultrasound performed 08/26/2015 as well as CT scan of the abdomen pelvis performed 08/20/2015 were reviewed in detail with the patient and the patient's husband.  While the patient denies more worrisome symptoms such as unintentional weight loss or decreased energy level, I am concerned as to the exact etiology of this solitary dominant pelvic mass. While potentially (and hopefully) this mass is a large exophytic solitary fibroid arising from the posterior aspect of the lower uterine segment, an ovarian mass/malignancy or leiomyosarcoma could haveve a similar appearance, especially in the setting of a prominent perirectal lymph node which measures approximately 8 mm in diameter.  Additionally, I explained to the patient that given the large size of the fibroid, it may take (at least) several months until she notices a clinical improvement following even a technically successful embolization.  I expressed my concerns in detail to the patient via the translating husband.   At the present time, the patient and the patient's husband are uncertain as to their next course of action.   As they are weighing potential treatment options, I will coordinate to obtain a Pap smear and endometrial biopsy.  If the patient ultimately decides to undergo a uterine fibroid embolization, I would obtain a contrast pelvic MRI to help better determine the etiology and imaging characteristics of this pelvic mass.  If and when the pelvic MRI is obtained, I will see the patient again in interventional radiology clinic to discuss the results and to  ensure understanding of the expected outcomes even after a technically successful uterine embolization in the setting of a fibroid of this size.  Thank you for this interesting consult.  I greatly enjoyed meeting Missie JALEY MONTEALEGRE and look forward to participating in their care.  A copy of this report was sent to the requesting provider on this date.  Electronically Signed: Sandi Mariscal 09/09/2015, 5:36 PM   I spent a total of 40 Minutes in face to face in clinical consultation, greater than 50% of which was counseling/coordinating care for symptomatic uterine fibroids

## 2015-09-10 ENCOUNTER — Telehealth: Payer: Self-pay | Admitting: *Deleted

## 2015-09-10 NOTE — Telephone Encounter (Signed)
lft msg on nurse line to call patient and schedule Pap and Endo Bx per Dr Pascal Lux, before we can proceed with potentially scheduling the [place] procedure. I have also faxed the office note to Dr Daisy Blossom

## 2015-09-10 NOTE — Telephone Encounter (Signed)
Dr Pascal Lux office called to let us know that Dr. Pascal Lux would like for [place] to do a pap and endometrial biopsy prior to him making a decision about surgery. Office notes are being faxed to [place]. Message routed to Dr. Elly Modena to inform her and to front desk to get patient scheduled.

## 2015-09-15 ENCOUNTER — Other Ambulatory Visit: Payer: Self-pay | Admitting: Obstetrics and Gynecology

## 2015-09-15 DIAGNOSIS — Z1231 Encounter for screening mammogram for malignant neoplasm of breast: Secondary | ICD-10-CM

## 2015-09-27 ENCOUNTER — Ambulatory Visit
Admission: RE | Admit: 2015-09-27 | Discharge: 2015-09-27 | Disposition: A | Discharge status: Home / Self Care | Payer: No Typology Code available for payment source | Source: Ambulatory Visit | Attending: Obstetrics and Gynecology | Admitting: Obstetrics and Gynecology

## 2015-09-27 DIAGNOSIS — Z1231 Encounter for screening mammogram for malignant neoplasm of breast: Secondary | ICD-10-CM

## 2015-09-29 ENCOUNTER — Other Ambulatory Visit: Payer: Self-pay | Admitting: Obstetrics and Gynecology

## 2015-09-29 DIAGNOSIS — R928 Other abnormal and inconclusive findings on diagnostic imaging of breast: Secondary | ICD-10-CM

## 2015-10-04 ENCOUNTER — Other Ambulatory Visit (HOSPITAL_COMMUNITY)
Admission: RE | Admit: 2015-10-04 | Discharge: 2015-10-04 | Disposition: A | Discharge status: Home / Self Care | Payer: No Typology Code available for payment source | Source: Ambulatory Visit | Attending: Obstetrics and Gynecology | Admitting: Obstetrics and Gynecology

## 2015-10-04 ENCOUNTER — Ambulatory Visit (INDEPENDENT_AMBULATORY_CARE_PROVIDER_SITE_OTHER): Payer: Self-pay | Admitting: Obstetrics and Gynecology

## 2015-10-04 VITALS — BP 137/103 | HR 74 | Temp 98.2°F | Wt 157.0 lb

## 2015-10-04 DIAGNOSIS — Z1151 Encounter for screening for human papillomavirus (HPV): Secondary | ICD-10-CM

## 2015-10-04 DIAGNOSIS — N939 Abnormal uterine and vaginal bleeding, unspecified: Secondary | ICD-10-CM

## 2015-10-04 DIAGNOSIS — Z01812 Encounter for preprocedural laboratory examination: Secondary | ICD-10-CM

## 2015-10-04 DIAGNOSIS — Z124 Encounter for screening for malignant neoplasm of cervix: Secondary | ICD-10-CM

## 2015-10-04 LAB — COMPREHENSIVE METABOLIC PANEL
ALBUMIN: 4 g/dL (ref 3.6–5.1)
ALT: 25 U/L (ref 6–29)
AST: 26 U/L (ref 10–30)
Alkaline Phosphatase: 49 U/L (ref 33–115)
BILIRUBIN TOTAL: 0.3 mg/dL (ref 0.2–1.2)
BUN: 9 mg/dL (ref 7–25)
CALCIUM: 9.2 mg/dL (ref 8.6–10.2)
CHLORIDE: 101 mmol/L (ref 98–110)
CO2: 24 mmol/L (ref 20–31)
Creat: 0.69 mg/dL (ref 0.50–1.10)
GLUCOSE: 81 mg/dL (ref 65–99)
POTASSIUM: 4.9 mmol/L (ref 3.5–5.3)
Sodium: 138 mmol/L (ref 135–146)
Total Protein: 6.8 g/dL (ref 6.1–8.1)

## 2015-10-04 LAB — POCT PREGNANCY, URINE: Preg Test, Ur: NEGATIVE

## 2015-10-04 MED ORDER — MEGESTROL ACETATE 40 MG PO TABS: 40.0000 mg | ORAL_TABLET | Freq: Two times a day (BID) | ORAL | Status: DC

## 2015-10-04 NOTE — Addendum Note (Signed)
Addended by: Shelly Coss on: 10/04/2015 02:41 PM   Modules accepted: Orders

## 2015-10-04 NOTE — Addendum Note (Signed)
Addended by: Phillip Heal, DEMETRICE A on: 10/04/2015 02:24 PM   Modules accepted: Orders

## 2015-10-04 NOTE — Addendum Note (Signed)
Addended by: Samuel Germany on: 10/04/2015 05:12 PM   Modules accepted: Orders

## 2015-10-04 NOTE — Progress Notes (Signed)
Patient ID: Felicia Roman, female   DOB: Jun 27, 1975, 40 y.o.   MRN: [id number] 40 yo [id number] presenting today for endometrial biopsy and pap smear. The patient is currently being evaluated for possible uterine artery embolization for the management of fibroid uterus. Patient has monthly cycles lasting 4-5 days which are heavy in flow and associated with passage of clots. She reports pain associated with the first few days of her cycle. She treated herself with ibuprofen and percocet with good response. She has only experience the intense pain once since she was last seen by me. She admits that this history is completely different from what she discussed with me at her last visit. Her pain is solely associated with her cycles  No past medical history on file. No past surgical history on file. No family history on file. Social History  Substance Use Topics  . Smoking status: Never Smoker   . Smokeless tobacco: Not on file  . Alcohol Use: No   ROS See pertinent in HPI  GENERAL: Well-developed, well-nourished female in no acute distress.  ABDOMEN: Soft, nontender, nondistended. No organomegaly. PELVIC: Normal external female genitalia. Vagina is pink and rugated.  Normal discharge. Normal appearing cervix. Uterus is 12-weeks in size. No adnexal mass or tenderness. EXTREMITIES: No cyanosis, clubbing, or edema, 2+ distal pulses.  A/P 40 yo here for endometrial biopsy and pap smear  - pap smear collected - discussed the risks and benefits of endometrial biopsy ENDOMETRIAL BIOPSY     The indications for endometrial biopsy were reviewed.   Risks of the biopsy including cramping, bleeding, infection, uterine perforation, inadequate specimen and need for additional procedures  were discussed. The patient states she understands and agrees to undergo procedure today. Consent was signed. Time out was performed. Urine HCG was negative. A sterile speculum was placed in the patient's vagina and the cervix was  prepped with Betadine. A single-toothed tenaculum was placed on the anterior lip of the cervix to stabilize it. The uterine cavity was sounded to a depth of 10 cm using the uterine sound. The 3 mm pipelle was introduced into the endometrial cavity without difficulty, 2 passes were made.  A  moderate amount of tissue was  sent to pathology. The instruments were removed from the patient's vagina. Minimal bleeding from the cervix was noted. The patient tolerated the procedure well.  Routine post-procedure instructions were given to the patient. The patient will follow up in two weeks to review the results and for further management.  - Discussed medical management with Megace. Patient is interested in trying but she would like to pursue the avenue of interventional radiology - Pelvic MRI will be ordered as requested. Patient to follow up with IR for further interventions - RTC in 3 months if good results with Megace

## 2015-10-04 NOTE — Addendum Note (Signed)
Addended by: Phillip Heal, Fiorela Pelzer A on: 10/04/2015 02:29 PM   Modules accepted: Orders

## 2015-10-06 LAB — CYTOLOGY - PAP

## 2015-10-08 ENCOUNTER — Other Ambulatory Visit: Payer: Self-pay

## 2015-10-08 ENCOUNTER — Telehealth: Payer: Self-pay | Admitting: *Deleted

## 2015-10-08 DIAGNOSIS — Z01818 Encounter for other preprocedural examination: Secondary | ICD-10-CM

## 2015-10-08 NOTE — Telephone Encounter (Signed)
-----   Message from Mora Bellman, MD sent at 10/06/2015  5:45 PM EDT ----- Please explain to the patient that there was an insufficient sampling for the endometrial biopsy and that it needs to be repeated. She should be prescribed cytotec 800 micrograms to take vaginally on the evening prior to her scheduled in-office repeat endometrial biopsy  Pap smear results are still pending  Thanks  Peggy

## 2015-10-08 NOTE — Telephone Encounter (Signed)
[place] interpreter [phone] used to call patient. There was no answer, voice mail was left staing I am calling about scheduling an appointment, please return to call at the clinics.

## 2015-10-12 ENCOUNTER — Ambulatory Visit (HOSPITAL_COMMUNITY)
Admission: RE | Admit: 2015-10-12 | Discharge: 2015-10-12 | Disposition: A | Discharge status: Home / Self Care | Payer: Self-pay | Source: Ambulatory Visit | Attending: Obstetrics and Gynecology | Admitting: Obstetrics and Gynecology

## 2015-10-12 DIAGNOSIS — N888 Other specified noninflammatory disorders of cervix uteri: Secondary | ICD-10-CM | POA: Insufficient documentation

## 2015-10-12 DIAGNOSIS — D250 Submucous leiomyoma of uterus: Secondary | ICD-10-CM | POA: Insufficient documentation

## 2015-10-12 DIAGNOSIS — N939 Abnormal uterine and vaginal bleeding, unspecified: Secondary | ICD-10-CM | POA: Insufficient documentation

## 2015-10-12 MED ORDER — GADOBENATE DIMEGLUMINE 529 MG/ML IV SOLN
14.0000 mL | Freq: Once | INTRAVENOUS | Status: AC | PRN
  Administered 2015-10-12: 14 mL via INTRAVENOUS

## 2015-10-13 ENCOUNTER — Ambulatory Visit
Admission: RE | Admit: 2015-10-13 | Discharge: 2015-10-13 | Disposition: A | Discharge status: Home / Self Care | Payer: Self-pay | Source: Ambulatory Visit | Attending: Obstetrics and Gynecology | Admitting: Obstetrics and Gynecology

## 2015-10-13 ENCOUNTER — Other Ambulatory Visit: Payer: Self-pay | Admitting: Obstetrics and Gynecology

## 2015-10-13 DIAGNOSIS — R928 Other abnormal and inconclusive findings on diagnostic imaging of breast: Secondary | ICD-10-CM

## 2015-10-13 MED ORDER — MISOPROSTOL 200 MCG PO TABS: 800.0000 ug | ORAL_TABLET | Freq: Once | ORAL | Status: DC

## 2015-10-13 NOTE — Telephone Encounter (Signed)
Called patient & spoke with her husband (release signed) and informed him of pap results & need for repeat bx. Also informed him of medication sent to pharmacy & instructions. He verbalized understanding to all & will await phone call for appt. He had no questions

## 2015-10-27 ENCOUNTER — Ambulatory Visit
Admission: RE | Admit: 2015-10-27 | Discharge: 2015-10-27 | Disposition: A | Discharge status: Home / Self Care | Payer: No Typology Code available for payment source | Source: Ambulatory Visit | Attending: Obstetrics and Gynecology | Admitting: Obstetrics and Gynecology

## 2015-10-27 DIAGNOSIS — D259 Leiomyoma of uterus, unspecified: Secondary | ICD-10-CM

## 2015-10-27 NOTE — Progress Notes (Signed)
Patient ID: Felicia Roman, female   DOB: [birth year], 40 y.o.   MRN: [id number]        Chief Complaint: Symptomatic uterine fibroids  Referring Physician(s): Constant,Peggy  History of Present Illness: Felicia Roman is a 40 y.o. (G4, P2) female with no significant past medical history returns at interventional radiology clinic for evaluation of potential percutaneous treatment options for symptomatic uterine fibroids following the acquisition of a contrast enhanced pelvic MRI performed 10/12/2015. The patient is accompanied by her husband who serves as her translator though the patient does understand a fair amount of [place].  Since last seen being seen at the interventional radiology clinic on 09/09/2015, the patient states that her dysmenorrhagia and constipation symptoms have improved, thought she still admits to nocturia.   The patient has been started Megestrol AC 40 mg twice a day. While it is likely too soon to tell if there is been a significant improvement in her menorrhagia since starting hormonal therapy, the patient had reported a long history of menorrhagia including the passage of clots in the sitting the changing of pads every 1-2 hours..  Patient is otherwise without complaint. No unintentional weight loss or weight gain. No fever or chills.   No past medical history on file.  No past surgical history on file.  Allergies: Review of patient's allergies indicates no known allergies.  Medications: Prior to Admission medications   Medication Sig Start Date End Date Taking? Authorizing Provider  docusate sodium (COLACE) 100 MG capsule Take 1 capsule (100 mg total) by mouth every 12 (twelve) hours. Patient not taking: Reported on 09/06/2015 08/26/15   West Pugh, NP  HYDROcodone-acetaminophen (NORCO/VICODIN) 5-325 MG tablet Take 2 tablets by mouth every 4 (four) hours as needed. 08/21/15   Okey Regal, PA-C  ibuprofen (ADVIL,MOTRIN) 800 MG tablet Take 1 tablet (800 mg  total) by mouth 3 (three) times daily. Patient not taking: Reported on 09/09/2015 08/26/15   West Pugh, NP  megestrol (MEGACE) 40 MG tablet Take 1 tablet (40 mg total) by mouth 2 (two) times daily. 10/04/15   Peggy Constant, MD  misoprostol (CYTOTEC) 200 MCG tablet Place 4 tablets (800 mcg total) vaginally once. 10/13/15   Mora Bellman, MD  NON FORMULARY Herbal supplement for constipation    Historical Provider, MD  oxyCODONE-acetaminophen (PERCOCET/ROXICET) 5-325 MG tablet Take 2 tablets by mouth every 4 (four) hours as needed for severe pain. Patient not taking: Reported on 09/06/2015 08/26/15   West Pugh, NP  traMADol (ULTRAM) 50 MG tablet Take 1 tablet (50 mg total) by mouth every 6 (six) hours as needed. Patient not taking: Reported on 09/06/2015 08/21/15   Okey Regal, PA-C     No family history on file.  Social History   Social History  . Marital Status: Married    Spouse Name: N/A  . Number of Children: N/A  . Years of Education: N/A   Social History Main Topics  . Smoking status: Never Smoker   . Smokeless tobacco: Not on file  . Alcohol Use: No  . Drug Use: Not on file  . Sexual Activity: Not on file   Other Topics Concern  . Not on file   Social History Narrative    ECOG Status: 0 - Asymptomatic  Review of Systems: A 12 point ROS discussed and pertinent positives are indicated in the HPI above.  All other systems are negative.  Review of Systems  Constitutional: Negative for activity change and appetite change.  Respiratory:  Negative.   Cardiovascular: Negative.   Gastrointestinal: Positive for constipation.  Genitourinary: Positive for frequency, menstrual problem and pelvic pain.  Musculoskeletal: Positive for back pain.    Vital Signs: BP 129/92 mmHg  Pulse 83  Temp(Src) 97.7 F (36.5 C) (Oral)  Resp 14  SpO2 100%  LMP 09/30/2015 (Exact Date)  Physical Exam  Constitutional: She appears well-developed and well-nourished.  HENT:  Head:  Atraumatic.  Cardiovascular: Normal rate, regular rhythm and intact distal pulses.   Easily palpable right common femoral and dorsalis pedis arterial pulses  Pulmonary/Chest: Effort normal and breath sounds normal.  Abdominal: She exhibits no mass. There is tenderness.  Patient is tender with palpation of the right lower abdominal quadrant. I am unable to palpate the uterine fundus.  Skin: Skin is warm and dry.  Psychiatric: She has a normal mood and affect. Her behavior is normal.     Imaging: Mr Pelvis W Wo Contrast  10/13/2015  CLINICAL DATA:  Evaluate mass posterior to the uterus. EXAM: MRI PELVIS WITHOUT CONTRAST TECHNIQUE: Multiplanar multisequence MR imaging of the pelvis was performed. No intravenous contrast was administered. COMPARISON:  None. FINDINGS: Uterus: Measures 12.1 cm in length, 8.9 cm AP, and 10.7 cm transverse. There is a large subserosal fibroid arising from the posterior uterine fundus which measures 11 x 9.3 by 7.9 cm. There is homogeneous enhancement of this mass following IV contrast administration. Endometrium: The large posterior fundal fibroid extends to the submucosa, image 17 of series 12. Cervix/Vagina: There are numerous nabothian cysts identified. These measure up to 1.5 cm. Right ovary:  Appears normal.  No mass identified. Left ovary:  Appears normal.  No mass identified. Urinary Tract:  Bladder appears normal. Bowel:  Unremarkable visualized pelvic bowel loops. Vascular/Lymphatic: No pathologically enlarged lymph nodes. No significant vascular abnormality seen. Other:  None. Musculoskeletal: Posterior disc bulge is identified at the L5-S1 level. IMPRESSION: 1. Large posterior uterine fibroid is identified. This is partially submucosal. The majority of this fibroid is subserosal. 2. Nabothian cysts. Electronically Signed   By: Kerby Moors M.D.   On: 10/13/2015 09:11   US Breast Ltd Uni Left Inc Axilla  10/13/2015  CLINICAL DATA:  The patient returns after  baseline screening study for evaluation of both breasts. EXAM: 2D DIGITAL DIAGNOSTIC BILATERAL MAMMOGRAM WITH CAD AND ADJUNCT TOMO ULTRASOUND BILATERAL BREAST COMPARISON:  258 2017 ACR Breast Density Category c: The breast tissue is heterogeneously dense, which may obscure small masses. FINDINGS: Tomosynthesis images are performed, showing no persistent asymmetry in the right breast. A circumscribed mass is identified in the medial aspect of the left breast. There is asymmetry in the upper-outer quadrant of the left breast, further evaluated with ultrasound. Mammographic images were processed with CAD. On physical exam, I palpate no abnormality in the medial aspect of the left breast. I palpate no abnormality in the upper-outer quadrant of the left breast. Targeted ultrasound is performed, showing a simple cyst in the 10 o'clock location of the left breast 3 cm from the nipple measuring 1.5 x 1.7 x 0.6 cm. An adjacent small cyst is 5 mm. In the 2 o'clock location 3 cm from the nipple a solid mass measures 1.0 x 0.7 x 0.6 cm. This appears taller than wide and is associated increased through transmission. No internal blood flow identified on Doppler evaluation. Evaluation of the upper-outer quadrant of the left breast otherwise negative. Evaluation of the left axilla is negative for adenopathy. IMPRESSION: 1. No persistent abnormality in the right breast.  2. Simple cyst in the 10 o'clock location of the left breast. 3. Solid indeterminate mass in the 2 o'clock location of the left breast. 4. Asymmetry in the upper-outer quadrant of the left breast is felt to represent benign breast parenchyma and warrants follow-up to document stability. 5. No adenopathy. RECOMMENDATION: Ultrasound-guided core biopsy of mass in the left breast 2 o'clock location. [facility] appointment is scheduled for the patient on October 21, 2015. If biopsy is benign, left diagnostic mammogram and possible ultrasound are recommended in 6 months to  establish stability of probable asymmetric tissue in the upper-outer quadrant. I have discussed the findings and recommendations with the patient. Results were also provided in writing at the conclusion of the visit. If applicable, a reminder letter will be sent to the patient regarding the next appointment. BI-RADS CATEGORY  4: Suspicious. Electronically Signed   By: Nolon Nations M.D.   On: 10/13/2015 14:03   Mm Diag Breast Tomo Bilateral  10/13/2015  CLINICAL DATA:  The patient returns after baseline screening study for evaluation of both breasts. EXAM: 2D DIGITAL DIAGNOSTIC BILATERAL MAMMOGRAM WITH CAD AND ADJUNCT TOMO ULTRASOUND BILATERAL BREAST COMPARISON:  258 2017 ACR Breast Density Category c: The breast tissue is heterogeneously dense, which may obscure small masses. FINDINGS: Tomosynthesis images are performed, showing no persistent asymmetry in the right breast. A circumscribed mass is identified in the medial aspect of the left breast. There is asymmetry in the upper-outer quadrant of the left breast, further evaluated with ultrasound. Mammographic images were processed with CAD. On physical exam, I palpate no abnormality in the medial aspect of the left breast. I palpate no abnormality in the upper-outer quadrant of the left breast. Targeted ultrasound is performed, showing a simple cyst in the 10 o'clock location of the left breast 3 cm from the nipple measuring 1.5 x 1.7 x 0.6 cm. An adjacent small cyst is 5 mm. In the 2 o'clock location 3 cm from the nipple a solid mass measures 1.0 x 0.7 x 0.6 cm. This appears taller than wide and is associated increased through transmission. No internal blood flow identified on Doppler evaluation. Evaluation of the upper-outer quadrant of the left breast otherwise negative. Evaluation of the left axilla is negative for adenopathy. IMPRESSION: 1. No persistent abnormality in the right breast. 2. Simple cyst in the 10 o'clock location of the left breast. 3.  Solid indeterminate mass in the 2 o'clock location of the left breast. 4. Asymmetry in the upper-outer quadrant of the left breast is felt to represent benign breast parenchyma and warrants follow-up to document stability. 5. No adenopathy. RECOMMENDATION: Ultrasound-guided core biopsy of mass in the left breast 2 o'clock location. [facility] appointment is scheduled for the patient on October 21, 2015. If biopsy is benign, left diagnostic mammogram and possible ultrasound are recommended in 6 months to establish stability of probable asymmetric tissue in the upper-outer quadrant. I have discussed the findings and recommendations with the patient. Results were also provided in writing at the conclusion of the visit. If applicable, a reminder letter will be sent to the patient regarding the next appointment. BI-RADS CATEGORY  4: Suspicious. Electronically Signed   By: Nolon Nations M.D.   On: 10/13/2015 14:03    Labs:  CBC:  Recent Labs  08/20/15 2225  WBC 10.7*  HGB 11.2*  HCT 35.4*  PLT 364    COAGS: No results for input(s): INR, APTT in the last 8760 hours.  BMP:  Recent Labs  08/20/15 2225  10/04/15 1444  NA 137 138  K 3.9 4.9  CL 104 101  CO2 24 24  GLUCOSE 92 81  BUN 8 9  CALCIUM 9.5 9.2  CREATININE 0.74 0.69  GFRNONAA >60  --   GFRAA >60  --     LIVER FUNCTION TESTS:  Recent Labs  10/04/15 1444  BILITOT 0.3  AST 26  ALT 25  ALKPHOS 49  PROT 6.8  ALBUMIN 4.0   Assessment and Plan:  Felicia Roman is a 40 y.o. (G4, P2) female with no significant past medical history returns at interventional radiology clinic for evaluation of potential percutaneous treatment options for symptomatic uterine fibroids following the acquisition of a contrast enhanced pelvic MRI performed 10/12/2015.   Personal review of contrast enhanced pelvic MRI performed 10/12/2015 demonstrates a large (approximately 11.3 x 9.0 x 8.9 cm) solitary exophytic fibroid arising from the posterior aspect of  the lower uterine segment. This fibroid is noted to demonstrate avid enhancement on the postcontrast phase images.  Selected images were reviewed in detail with the patient and the patient's husband.  I'm very pleased that the previously indeterminate pelvic structure is now favored to represent a dominant enlarged fibroid (as opposed to an ovarian malignancy or uterine sarcoma) and given the avid enhancement of the fibroid and the postcontrast images, the patient is a candidate for uterine fibroid embolization.    Benefits and risks of uterine fibroid embolization, including but not limited to, contrast and radiation exposure, vessel injury and bleeding, nontarget embolization and chance for the development of new symptomatic fibroids prior to the onset of menopause, were discussed in detail with the patient.  Again, I explained to the patient and the patient's husband that given the large size of the fibroid it could be several months until the patient notices a significant improvement in her bulk related symptoms.  I would however anticipate that her menorrhagia symptoms would improved sooner, potentially within the subsequent 1-2 months.  Following this prolonged and detailed conversation with the patient and the patient's husband, at the present time, the patient wishes to pursue definitive treatment with hysterectomy given the large size of the fibroid.  As such, the patient was encouraged to follow-up with Dr. Elly Modena to relay her decision to proceed with definitive surgical resection.  The patient the patient's husband were encouraged to call the interventional radiology clinic if they have a change of heart and would like to pursue embolization in the future.  A copy of this report was sent to the requesting provider on this date.  Electronically Signed: Sandi Mariscal 10/27/2015, 4:08 PM   I spent a total of 25 Minutes in face to face in clinical consultation, greater than 50% of which was  counseling/coordinating care for symptomatic uterine fibroids

## 2015-10-28 ENCOUNTER — Telehealth: Payer: Self-pay

## 2015-10-28 NOTE — Telephone Encounter (Signed)
Patient husband left a message of some sort concerning a biopsy appointment for his wife.  He is non-english speaking. Please call back

## 2015-11-02 ENCOUNTER — Other Ambulatory Visit: Payer: Self-pay | Admitting: Obstetrics and Gynecology

## 2015-11-02 DIAGNOSIS — D259 Leiomyoma of uterus, unspecified: Secondary | ICD-10-CM

## 2015-11-02 NOTE — Telephone Encounter (Addendum)
Message left for pt and her husband (release of information previously signed) using Oriental interpreter [phone]. I stated that an appt has been scheduled for 6/22 @ 3:15 pm. (repeat endometrial biopsy)  She will need to use the medication in her vagina as previously ordered on the night before the appt.

## 2015-11-08 ENCOUNTER — Other Ambulatory Visit: Payer: Self-pay | Admitting: Obstetrics and Gynecology

## 2015-11-08 DIAGNOSIS — N6320 Unspecified lump in the left breast, unspecified quadrant: Secondary | ICD-10-CM

## 2015-11-11 ENCOUNTER — Ambulatory Visit (HOSPITAL_COMMUNITY): Payer: No Typology Code available for payment source

## 2015-11-11 ENCOUNTER — Ambulatory Visit: Admission: RE | Admit: 2015-11-11 | Payer: No Typology Code available for payment source | Source: Ambulatory Visit

## 2015-11-11 ENCOUNTER — Other Ambulatory Visit: Payer: No Typology Code available for payment source | Admitting: Obstetrics and Gynecology

## 2015-11-11 ENCOUNTER — Encounter: Payer: Self-pay | Admitting: Obstetrics and Gynecology

## 2015-11-11 ENCOUNTER — Ambulatory Visit (INDEPENDENT_AMBULATORY_CARE_PROVIDER_SITE_OTHER): Payer: Self-pay | Admitting: Obstetrics and Gynecology

## 2015-11-11 ENCOUNTER — Other Ambulatory Visit: Payer: Self-pay | Admitting: Obstetrics and Gynecology

## 2015-11-11 VITALS — BP 140/80 | HR 86 | Wt 156.6 lb

## 2015-11-11 DIAGNOSIS — N938 Other specified abnormal uterine and vaginal bleeding: Secondary | ICD-10-CM

## 2015-11-11 DIAGNOSIS — N882 Stricture and stenosis of cervix uteri: Secondary | ICD-10-CM

## 2015-11-11 LAB — POCT PREGNANCY, URINE: Preg Test, Ur: NEGATIVE

## 2015-11-11 NOTE — Progress Notes (Signed)
40 yo with fibroid uterus and DUB here for repeat endometrial biopsy. Patient reports improvement in her vaginal bleeding since initiation of megace. Previous endometrial biopsy was inadequate to analyze. The patient was instructed to take cytotec prior to this procedure, which she reports taking yesterday evening.  GENERAL: Well-developed, well-nourished female in no acute distress.  ABDOMEN: Soft, nontender, nondistended. No organomegaly. PELVIC: Normal external female genitalia. Vagina is pink and rugated.  Normal discharge. Normal appearing cervix. Uterus is normal in size.  No adnexal mass or tenderness. EXTREMITIES: No cyanosis, clubbing, or edema, 2+ distal pulses.  A/P 40 yo with fibroid uterus and DUB here for endometrial biopsy ENDOMETRIAL BIOPSY     The indications for endometrial biopsy were reviewed.   Risks of the biopsy including cramping, bleeding, infection, uterine perforation, inadequate specimen and need for additional procedures  were discussed. The patient states she understands and agrees to undergo procedure today. Consent was signed. Time out was performed. Urine HCG was negative. A sterile speculum was placed in the patient's vagina and the cervix was prepped with Betadine. A single-toothed tenaculum was placed on the anterior lip of the cervix to stabilize it. It was difficult to insert the pipelle and dilate the cervix with dilators due to patient discomfort. The procedure was aborted and the patient will be scheduled for [facility] with hysteroscopy.  Risks and benefits of D&C were reviewed with the patient including but not limited to risks of bleeding, infection, uterine perforation and damage to adjacent organs. Patient verbalized understanding and will be scheduled for the procedure.

## 2015-11-16 ENCOUNTER — Ambulatory Visit
Admission: RE | Admit: 2015-11-16 | Discharge: 2015-11-16 | Disposition: A | Discharge status: Home / Self Care | Payer: No Typology Code available for payment source | Source: Ambulatory Visit | Attending: Obstetrics and Gynecology | Admitting: Obstetrics and Gynecology

## 2015-11-16 DIAGNOSIS — D259 Leiomyoma of uterus, unspecified: Secondary | ICD-10-CM

## 2015-11-16 NOTE — Progress Notes (Signed)
Patient ID: Felicia Roman, female   DOB: 1976/02/02, 40 y.o.   MRN: [id number]        Chief Complaint: Symptomatic uterine fibroids  Referring Physician(s): Constant,Peggy  History of Present Illness: Felicia Roman is a 40 y.o. (G4, P2) female with no significant past medical history who yet again returns to the interventional radiology clinic for evaluation of potential treatment options for symptomatic uterine fibroids. The patient was initially seen in consultation on 09/09/2015 and again on 10/27/2015.   At the conclusion of the 10/27/2015 appointment, the patient stated that she did NOT wish to undergo an embolization rather she elected to pursue hysterectomy, however following this appointment, the patient called the IR clinic requesting to schedule the embolization.   As I was concerned regarding the level of understanding of procedural expectations from both the patient and the patient's husband, they have returned to the interventional radiology clinic to discuss risks and benefits of uterine artery embolization, now with the use of a medical translator.  In summary (and confirmed today via the medical translator) the patient's main fibroid related complaint is associated with menorrhagia,though she states this is improved since beginning Megestrol AC 40 mg BID. She does report however daily spotting since the onset of her most recent cycle on June 12.  The patient also complains at this menorrhagia, worse at the time of her cycle though states this is also improved with the occasional use of Percocet 5/325 (the patient states she takes approximately 1-2 pills per day).   Finally, the patient admits to bulk symptoms including constipation, urinary urgency and frequency. No fever or chills.  No past medical history on file.  No past surgical history on file.  Allergies: Review of patient's allergies indicates no known allergies.  Medications: Prior to Admission medications     Medication Sig Start Date End Date Taking? Authorizing Provider  docusate sodium (COLACE) 100 MG capsule Take 1 capsule (100 mg total) by mouth every 12 (twelve) hours. Patient not taking: Reported on 09/06/2015 08/26/15   West Pugh, NP  HYDROcodone-acetaminophen (NORCO/VICODIN) 5-325 MG tablet Take 2 tablets by mouth every 4 (four) hours as needed. Patient not taking: Reported on 11/11/2015 08/21/15   Okey Regal, PA-C  ibuprofen (ADVIL,MOTRIN) 800 MG tablet Take 1 tablet (800 mg total) by mouth 3 (three) times daily. Patient not taking: Reported on 09/09/2015 08/26/15   West Pugh, NP  megestrol (MEGACE) 40 MG tablet Take 1 tablet (40 mg total) by mouth 2 (two) times daily. 10/04/15   Peggy Constant, MD  misoprostol (CYTOTEC) 200 MCG tablet Place 4 tablets (800 mcg total) vaginally once. 10/13/15   Mora Bellman, MD  NON FORMULARY Herbal supplement for constipation    Historical Provider, MD  oxyCODONE-acetaminophen (PERCOCET/ROXICET) 5-325 MG tablet Take 2 tablets by mouth every 4 (four) hours as needed for severe pain. Patient not taking: Reported on 09/06/2015 08/26/15   West Pugh, NP  traMADol (ULTRAM) 50 MG tablet Take 1 tablet (50 mg total) by mouth every 6 (six) hours as needed. Patient not taking: Reported on 09/06/2015 08/21/15   Okey Regal, PA-C     No family history on file.  Social History   Social History  . Marital Status: Married    Spouse Name: N/A  . Number of Children: N/A  . Years of Education: N/A   Social History Main Topics  . Smoking status: Never Smoker   . Smokeless tobacco: Not on file  . Alcohol Use:  No  . Drug Use: Not on file  . Sexual Activity: Not on file   Other Topics Concern  . Not on file   Social History Narrative    ECOG Status: 1 - Symptomatic but completely ambulatory  Review of Systems: A 12 point ROS discussed and pertinent positives are indicated in the HPI above.  All other systems are negative.  Review of  Systems  Constitutional: Negative for fever, chills, activity change and appetite change.  Respiratory: Negative.   Cardiovascular: Negative.   Gastrointestinal: Positive for constipation.  Genitourinary: Positive for urgency, frequency, menstrual problem and pelvic pain.    Vital Signs: LMP 11/01/2015  Physical Exam  Constitutional: She appears well-developed and well-nourished.  HENT:  Head: Normocephalic and atraumatic.  Cardiovascular: Normal rate, regular rhythm and intact distal pulses.   Easily palpable right common femoral and dorsalis pedis artery pulses.  Pulmonary/Chest: Effort normal and breath sounds normal.  Abdominal: She exhibits mass. There is tenderness.  I am able to palpate the uterus within the lower pelvis. The patient is tender to palpation primarily with the left lower abdominal quadrant/pelvis.  Skin: Skin is warm and dry.  Psychiatric: She has a normal mood and affect.  Nursing note and vitals reviewed.  Imaging:  Selected images from contrast enhanced pelvic MRI performed 10/12/2015 were again reviewed at the time of this encounter.  Labs:  CBC:  Recent Labs  08/20/15 2225  WBC 10.7*  HGB 11.2*  HCT 35.4*  PLT 364    COAGS: No results for input(s): INR, APTT in the last 8760 hours.  BMP:  Recent Labs  08/20/15 2225 10/04/15 1444  NA 137 138  K 3.9 4.9  CL 104 101  CO2 24 24  GLUCOSE 92 81  BUN 8 9  CALCIUM 9.5 9.2  CREATININE 0.74 0.69  GFRNONAA >60  --   GFRAA >60  --     LIVER FUNCTION TESTS:  Recent Labs  10/04/15 1444  BILITOT 0.3  AST 26  ALT 25  ALKPHOS 49  PROT 6.8  ALBUMIN 4.0    TUMOR MARKERS: No results for input(s): AFPTM, CEA, CA199, CHROMGRNA in the last 8760 hours.  Assessment and Plan:  Felicia Roman is a 40 y.o. (G4, P2) female with no significant past medical history who yet again returns to the interventional radiology clinic for evaluation of potential treatment options for symptomatic  uterine fibroids. The patient was initially seen in consultation on 09/09/2015 and again on 10/27/2015.   At the conclusion of the 10/27/2015 appointment, the patient stated that she did NOT wish to undergo an embolization rather she elected to pursue hysterectomy, however following this appointment, the patient called the IR clinic requesting to schedule the embolization.   As I was concerned regarding the level of understanding of procedural expectations from both the patient and the patient's husband, they have returned to the interventional radiology clinic to discuss risks and benefits of uterine artery embolization, now with the use of a medical translator.  The patient's primary fibroid related complaint is associated with menorrhagia and dysmenorrhagia though she states this has improved since beginning Megestrol AC 40 mg BID and with the occasional use of Percocet 5/325 (the patient states she takes approximately 1-2 pills per day). While improved, the patient still complains of left cell limiting fibroid related complaints and as such now wishes to pursue uterine fibroid embolization.  As such, prolonged conversations were held with the patient (via the use of a medical  translator) regarding potential treatment options including continued conservative management and hysterectomy. At the present time, the patient wishes to pursue treatment though wants to avoid a surgical operation.    As such, prolonged conversations were held with the patient regarding the benefits and risks (including but not limited to bleeding, vessel injury, infection, radiation and contrast exposure and nontarget embolization) of uterine fibroid ablation. Additionally, I explained to the patient that given the large size of her fibroid (measuring approximately 11 cm in maximal diameter), it may take (at least) 3-6 months until she noticed significant improvement in her bulk symptoms. I would however expect quicker improvement in  her menorrhagia symptoms. Following this prolonged and detailed conversation with the patient (again via the use of a medical translator), the patient wishes to pursue uterine fibroid embolization.  EPIC review following today's encounter demonstrated that Dr. Elly Modena is planning a D&C procedure with hysteroscopy.  As such, we will wait until Dr. Domenic Schwab procedure is performed and will schedule the embolization at a time when she is not on her menstrual cycle (potentiallythe week of July 24) at [facility].   The procedure will entail a overnight admission for continued observation and PCA usage.  The patient was again encouraged to call the interventional radiology clinic with any questions or concerns.  A copy of this report was sent to the requesting provider on this date.  Electronically Signed: Sandi Mariscal 11/16/2015, 4:55 PM   I spent a total of 25 Minutes in face to face in clinical consultation, greater than 50% of which was counseling/coordinating care for symptomatic uterine fibroids.

## 2015-11-18 ENCOUNTER — Other Ambulatory Visit (HOSPITAL_COMMUNITY): Payer: Self-pay | Admitting: Interventional Radiology

## 2015-11-18 ENCOUNTER — Ambulatory Visit (HOSPITAL_COMMUNITY)
Admission: RE | Admit: 2015-11-18 | Discharge: 2015-11-18 | Disposition: A | Discharge status: Home / Self Care | Payer: Self-pay | Source: Ambulatory Visit | Attending: Obstetrics and Gynecology | Admitting: Obstetrics and Gynecology

## 2015-11-18 ENCOUNTER — Encounter (HOSPITAL_COMMUNITY): Payer: Self-pay

## 2015-11-18 ENCOUNTER — Ambulatory Visit
Admission: RE | Admit: 2015-11-18 | Discharge: 2015-11-18 | Disposition: A | Discharge status: Home / Self Care | Payer: No Typology Code available for payment source | Source: Ambulatory Visit | Attending: Obstetrics and Gynecology | Admitting: Obstetrics and Gynecology

## 2015-11-18 ENCOUNTER — Telehealth: Payer: Self-pay | Admitting: *Deleted

## 2015-11-18 VITALS — BP 110/68 | Temp 98.1°F | Ht 64.25 in | Wt 157.0 lb

## 2015-11-18 DIAGNOSIS — N6320 Unspecified lump in the left breast, unspecified quadrant: Secondary | ICD-10-CM

## 2015-11-18 DIAGNOSIS — Z1239 Encounter for other screening for malignant neoplasm of breast: Secondary | ICD-10-CM

## 2015-11-18 DIAGNOSIS — D259 Leiomyoma of uterus, unspecified: Secondary | ICD-10-CM

## 2015-11-18 NOTE — Patient Instructions (Signed)
Educational materials on self breast awareness given. Explained to Felicia Roman that she did not need a Pap smear today due to last Pap smear was 10/04/2015.  Let her know [facility] will cover Pap smears every 3 years unless has a history of abnormal Pap smears. Referred patient to the [place] for left breast biopsy per recommendation. Appointment scheduled for Thursday, November 18, 2015 following [facility] appointment. Hue SHONTAYE OYAMA verbalized understanding.  [place], Arvil Chaco, RN 8:57 AM

## 2015-11-18 NOTE — Progress Notes (Signed)
Patient referred to [facility] by the [facility] due to recommending a left breast biopsy. Screening mammogram completed 09/27/2015 that recommended additional imaging of left breast. Left breast diagnostic mammogram and ultrasound completed 10/13/2015 that recommends a left biopsy.   Pap Smear: Pap smear not completed today. Last Pap smear was 10/04/2015 at the [place] and normal with negative HPV. Per patient her most recent Pap smear is the only Pap smear she has had. Last Pap smear result is in EPIC.  Physical exam: Breasts Right breast slightly larger than left breast that per patient is normal for her. No skin abnormalities bilateral breasts. No nipple retraction bilateral breasts. No nipple discharge bilateral breasts. No lymphadenopathy. No lumps palpated bilateral breasts. No lumps palpated at area of concern of left breast. Complaints of left breast tenderness around 2 o'clock and right lower outer breast tenderness on exam. Referred patient to the [place] for left breast biopsy per recommendation. Appointment scheduled for Thursday, November 18, 2015 following [facility] appointment.  Pelvic/Bimanual No Pap smear completed today since last Pap smear was 10/04/2015. Pap smear not indicated per [facility] guidelines.   Smoking History: Patient has never smoked.  Patient Navigation: Patient education provided. Access to services provided for patient through [place] program. [facility] Medicaid Paperwork completed.

## 2015-11-19 NOTE — Telephone Encounter (Signed)
Opened in error

## 2015-11-22 ENCOUNTER — Encounter (HOSPITAL_COMMUNITY): Payer: Self-pay | Admitting: *Deleted

## 2015-11-22 ENCOUNTER — Other Ambulatory Visit: Payer: No Typology Code available for payment source

## 2015-11-24 ENCOUNTER — Telehealth: Payer: Self-pay | Admitting: General Practice

## 2015-11-24 NOTE — Telephone Encounter (Signed)
Patient has upcoming surgery that is not covered by [facility] and needs to complete charity care application asap. Application mailed to patient. Called patient with pacific interpreter [phone], no answer- left message to call us back at the office in regards to your upcoming surgery

## 2015-11-25 ENCOUNTER — Encounter (HOSPITAL_COMMUNITY): Payer: Self-pay | Admitting: *Deleted

## 2015-11-26 NOTE — Telephone Encounter (Signed)
Patient's husband called and left message on nurse line stating she is returning our call

## 2015-11-30 NOTE — Progress Notes (Signed)
Patient has surgery scheduled at [facility] on 12/23/15.  The Language Line - Interpreter [place] [phone] [place] Language) was used to schedule PAT appointment on 12/14/15 at 10:30am.  Two slots were reserved for this appointment.  The system does not have an Interpreter for this language.  Will need to use language line for the 12/14/15 PAT appointment.

## 2015-12-13 NOTE — Patient Instructions (Signed)
Your procedure is scheduled on:  Thursday, December 23, 2015  Enter through the [employer] [facility] at: 11:00 AM  Pick up the phone at the desk and dial [phone].  Call this number if you have problems the morning of surgery: [phone].  Remember: Do NOT eat food:  After Midnight Wednesday, December 22, 2015  Do NOT drink clear liquids after:  8:30 AM day of surgery  Take these medicines the morning of surgery with a SIP OF WATER:  None  Do NOT wear jewelry (body piercing), metal hair clips/bobby pins, make-up, or nail polish. Do NOT wear lotions, powders, or perfumes.  You may wear deodorant. Do NOT shave for 48 hours prior to surgery. Do NOT bring valuables to the hospital. Contacts, dentures, or bridgework may not be worn into surgery.  Have a responsible adult drive you home and stay with you for 24 hours after your procedure

## 2015-12-14 ENCOUNTER — Encounter (HOSPITAL_COMMUNITY)
Admission: RE | Admit: 2015-12-14 | Discharge: 2015-12-14 | Disposition: A | Discharge status: Home / Self Care | Payer: Medicaid Other | Source: Ambulatory Visit | Attending: Obstetrics and Gynecology | Admitting: Obstetrics and Gynecology

## 2015-12-14 ENCOUNTER — Encounter (HOSPITAL_COMMUNITY): Payer: Self-pay

## 2015-12-14 DIAGNOSIS — Z01818 Encounter for other preprocedural examination: Secondary | ICD-10-CM | POA: Diagnosis not present

## 2015-12-14 HISTORY — DX: Dizziness and giddiness: R42

## 2015-12-14 HISTORY — DX: Gastro-esophageal reflux disease without esophagitis: K21.9

## 2015-12-14 HISTORY — DX: Other specified soft tissue disorders: M79.89

## 2015-12-14 HISTORY — DX: Pain in unspecified elbow: M25.529

## 2015-12-14 LAB — CBC
HEMATOCRIT: 35.6 % — AB (ref 36.0–46.0)
HEMOGLOBIN: 11.2 g/dL — AB (ref 12.0–15.0)
MCH: 22.1 pg — ABNORMAL LOW (ref 26.0–34.0)
MCHC: 31.5 g/dL (ref 30.0–36.0)
MCV: 70.2 fL — ABNORMAL LOW (ref 78.0–100.0)
Platelets: 420 10*3/uL — ABNORMAL HIGH (ref 150–400)
RBC: 5.07 MIL/uL (ref 3.87–5.11)
RDW: 16.9 % — ABNORMAL HIGH (ref 11.5–15.5)
WBC: 8.6 10*3/uL (ref 4.0–10.5)

## 2015-12-14 NOTE — Pre-Procedure Instructions (Signed)
Used language line for additional support with language for patient and her husband.  ID [id number]

## 2015-12-22 NOTE — H&P (Signed)
Felicia Roman is an 40 y.o. female [phone] here for [facility] with hysteroscopy. Patient with history of monthly menstrual cycles lasting 4-5 days. Her vaginal bleeding was heavy with passage of clots. She is currently under medical management with megace with good results. She is also being evaluated for possible uterine artery embolization. Attempt at endometrial biopsy failed in the office secondary to cervical stenosis and patient not being able to tolerate the procedure.  Pertinent Gynecological History: Menses: flow is moderate Contraception: none DES exposure: denies Blood transfusions: none Sexually transmitted diseases: no past history Previous GYN Procedures: none  Last mammogram: abnormal: had a normal biopsy and under the care of breast center Date: 2017 Last pap: normal Date: 09/2015   Menstrual History: Patient's last menstrual period was 11/01/2015 (exact date).    Past Medical History:  Diagnosis Date  . Cyst of soft tissue    both arms, head, left leg  . Dizziness   . Elbow pain   . GERD (gastroesophageal reflux disease)   . Headache    migraines    Past Surgical History:  Procedure Laterality Date  . WISDOM TOOTH EXTRACTION      Family History  Problem Relation Age of Onset  . Diabetes Mother   . Diabetes Father     Social History:  reports that she has never smoked. She has never used smokeless tobacco. She reports that she does not drink alcohol or use drugs.  Allergies: No Known Allergies  Prescriptions Prior to Admission  Medication Sig Dispense Refill Last Dose  . megestrol (MEGACE) 40 MG tablet Take 1 tablet (40 mg total) by mouth 2 (two) times daily. 60 tablet 3 12/22/2015 at Unknown time  . docusate sodium (COLACE) 100 MG capsule Take 1 capsule (100 mg total) by mouth every 12 (twelve) hours. (Patient not taking: Reported on 09/06/2015) 60 capsule 0 Not Taking  . HYDROcodone-acetaminophen (NORCO/VICODIN) 5-325 MG tablet Take 2 tablets by mouth every 4  (four) hours as needed. (Patient not taking: Reported on 11/11/2015) 15 tablet 0 Not Taking at Unknown time  . ibuprofen (ADVIL,MOTRIN) 800 MG tablet Take 1 tablet (800 mg total) by mouth 3 (three) times daily. (Patient not taking: Reported on 09/09/2015) 30 tablet 0 Not Taking at Unknown time  . misoprostol (CYTOTEC) 200 MCG tablet Place 4 tablets (800 mcg total) vaginally once. (Patient not taking: Reported on 12/23/2015) 4 tablet 0 Not Taking at Unknown time  . oxyCODONE-acetaminophen (PERCOCET/ROXICET) 5-325 MG tablet Take 2 tablets by mouth every 4 (four) hours as needed for severe pain. (Patient not taking: Reported on 12/23/2015) 30 tablet 0 Not Taking at Unknown time  . traMADol (ULTRAM) 50 MG tablet Take 1 tablet (50 mg total) by mouth every 6 (six) hours as needed. (Patient not taking: Reported on 09/06/2015) 15 tablet 0 Not Taking at Unknown time    ROS See pertinent in HPI Blood pressure (!) 135/96, pulse 69, temperature 98.6 F (37 C), temperature source Oral, resp. rate 18, last menstrual period 11/01/2015, SpO2 100 %. Physical Exam GENERAL: Well-developed, well-nourished female in no acute distress.  HEENT: Normocephalic, atraumatic. Sclerae anicteric.  NECK: Supple. Normal thyroid.  LUNGS: Clear to auscultation bilaterally.  HEART: Regular rate and rhythm.. ABDOMEN: Soft, nontender, nondistended. No organomegaly. PELVIC: Deferred to OR EXTREMITIES: No cyanosis, clubbing, or edema, 2+ distal pulses.  Results for orders placed or performed during the hospital encounter of 12/23/15 (from the past 24 hour(s))  Pregnancy, urine     Status: None  Collection Time: 12/23/15 11:00 AM  Result Value Ref Range   Preg Test, Ur NEGATIVE NEGATIVE    No results found.  Assessment/Plan: 40 yo [id number] here for D&C hysterocospy in order to obtain endometrial sampling as part of evaluation for [place] - Risks benefits and alternatives were reviewed and explained to the patient including but not  limited to risks of bleeding, infection, uterine perforation and damage to adjacent organs. Patient verbalized understanding and all questions were answered  Felicia Roman 12/23/2015, 11:40 AM2

## 2015-12-23 ENCOUNTER — Ambulatory Visit (HOSPITAL_COMMUNITY): Payer: Medicaid Other

## 2015-12-23 ENCOUNTER — Encounter (HOSPITAL_COMMUNITY): Payer: Self-pay | Admitting: Anesthesiology

## 2015-12-23 ENCOUNTER — Ambulatory Visit (HOSPITAL_COMMUNITY): Payer: Medicaid Other | Admitting: Anesthesiology

## 2015-12-23 ENCOUNTER — Encounter (HOSPITAL_COMMUNITY)
Admission: RE | Disposition: A | Discharge status: Home / Self Care | Payer: Self-pay | Source: Ambulatory Visit | Attending: Obstetrics and Gynecology

## 2015-12-23 ENCOUNTER — Ambulatory Visit (HOSPITAL_COMMUNITY)
Admission: RE | Admit: 2015-12-23 | Discharge: 2015-12-23 | Disposition: A | Discharge status: Home / Self Care | Payer: Medicaid Other | Source: Ambulatory Visit | Attending: Obstetrics and Gynecology | Admitting: Obstetrics and Gynecology

## 2015-12-23 DIAGNOSIS — D251 Intramural leiomyoma of uterus: Secondary | ICD-10-CM

## 2015-12-23 DIAGNOSIS — Z419 Encounter for procedure for purposes other than remedying health state, unspecified: Secondary | ICD-10-CM

## 2015-12-23 DIAGNOSIS — N939 Abnormal uterine and vaginal bleeding, unspecified: Secondary | ICD-10-CM | POA: Insufficient documentation

## 2015-12-23 DIAGNOSIS — K219 Gastro-esophageal reflux disease without esophagitis: Secondary | ICD-10-CM | POA: Diagnosis not present

## 2015-12-23 DIAGNOSIS — N938 Other specified abnormal uterine and vaginal bleeding: Secondary | ICD-10-CM

## 2015-12-23 HISTORY — PX: HYSTEROSCOPY WITH D & C: SHX1775

## 2015-12-23 LAB — PREGNANCY, URINE: PREG TEST UR: NEGATIVE

## 2015-12-23 SURGERY — DILATATION AND CURETTAGE /HYSTEROSCOPY
Anesthesia: General | Site: Vagina

## 2015-12-23 MED ORDER — FENTANYL CITRATE (PF) 100 MCG/2ML IJ SOLN
INTRAMUSCULAR | Status: AC
  Filled 2015-12-23: qty 2

## 2015-12-23 MED ORDER — LACTATED RINGERS IV SOLN: INTRAVENOUS | Status: DC

## 2015-12-23 MED ORDER — MIDAZOLAM HCL 5 MG/5ML IJ SOLN
INTRAMUSCULAR | Status: DC | PRN
  Administered 2015-12-23: 2 mg via INTRAVENOUS

## 2015-12-23 MED ORDER — MEPERIDINE HCL 25 MG/ML IJ SOLN: 6.2500 mg | INTRAMUSCULAR | Status: DC | PRN

## 2015-12-23 MED ORDER — PROPOFOL 10 MG/ML IV BOLUS
INTRAVENOUS | Status: AC
  Filled 2015-12-23: qty 20

## 2015-12-23 MED ORDER — IBUPROFEN 600 MG PO TABS: 600.0000 mg | ORAL_TABLET | Freq: Four times a day (QID) | ORAL | 1 refills | Status: AC | PRN

## 2015-12-23 MED ORDER — LIDOCAINE HCL (CARDIAC) 20 MG/ML IV SOLN
INTRAVENOUS | Status: AC
  Filled 2015-12-23: qty 5

## 2015-12-23 MED ORDER — CHLOROPROCAINE HCL 1 % IJ SOLN
INTRAMUSCULAR | Status: AC
  Filled 2015-12-23: qty 30

## 2015-12-23 MED ORDER — PROMETHAZINE HCL 25 MG/ML IJ SOLN: 6.2500 mg | INTRAMUSCULAR | Status: DC | PRN

## 2015-12-23 MED ORDER — CHLOROPROCAINE HCL 1 % IJ SOLN
INTRAMUSCULAR | Status: DC | PRN
  Administered 2015-12-23: 20 mL

## 2015-12-23 MED ORDER — PROPOFOL 10 MG/ML IV BOLUS
INTRAVENOUS | Status: DC | PRN
  Administered 2015-12-23: 200 mg via INTRAVENOUS

## 2015-12-23 MED ORDER — SCOPOLAMINE 1 MG/3DAYS TD PT72
MEDICATED_PATCH | TRANSDERMAL | Status: AC
  Filled 2015-12-23: qty 1

## 2015-12-23 MED ORDER — MEGESTROL ACETATE 40 MG PO TABS: 40.0000 mg | ORAL_TABLET | Freq: Three times a day (TID) | ORAL | 6 refills | Status: DC

## 2015-12-23 MED ORDER — DEXAMETHASONE SODIUM PHOSPHATE 4 MG/ML IJ SOLN
INTRAMUSCULAR | Status: DC | PRN
Start: 2015-12-23 — End: 2015-12-23
  Administered 2015-12-23: 10 mg via INTRAVENOUS

## 2015-12-23 MED ORDER — HYDROMORPHONE HCL 1 MG/ML IJ SOLN
INTRAMUSCULAR | Status: AC
  Administered 2015-12-23: 0.25 mg via INTRAVENOUS
  Filled 2015-12-23: qty 1

## 2015-12-23 MED ORDER — ONDANSETRON HCL 4 MG/2ML IJ SOLN
INTRAMUSCULAR | Status: DC | PRN
Start: 2015-12-23 — End: 2015-12-23
  Administered 2015-12-23: 4 mg via INTRAVENOUS

## 2015-12-23 MED ORDER — SODIUM CHLORIDE 0.9 % IR SOLN
Status: DC | PRN
  Administered 2015-12-23: 3000 mL

## 2015-12-23 MED ORDER — MIDAZOLAM HCL 2 MG/2ML IJ SOLN
INTRAMUSCULAR | Status: AC
  Filled 2015-12-23: qty 2

## 2015-12-23 MED ORDER — KETOROLAC TROMETHAMINE 30 MG/ML IJ SOLN
INTRAMUSCULAR | Status: AC
Start: 2015-12-23 — End: 2015-12-23
  Filled 2015-12-23: qty 1

## 2015-12-23 MED ORDER — DOCUSATE SODIUM 100 MG PO CAPS: 100.0000 mg | ORAL_CAPSULE | Freq: Two times a day (BID) | ORAL | 2 refills | Status: DC | PRN

## 2015-12-23 MED ORDER — LACTATED RINGERS IV SOLN
INTRAVENOUS | Status: DC
  Administered 2015-12-23 (×2): via INTRAVENOUS

## 2015-12-23 MED ORDER — HYDROMORPHONE HCL 1 MG/ML IJ SOLN
0.2500 mg | INTRAMUSCULAR | Status: DC | PRN
  Administered 2015-12-23 (×2): 0.25 mg via INTRAVENOUS

## 2015-12-23 MED ORDER — LIDOCAINE HCL (CARDIAC) 20 MG/ML IV SOLN
INTRAVENOUS | Status: DC | PRN
  Administered 2015-12-23: 100 mg via INTRAVENOUS

## 2015-12-23 MED ORDER — FENTANYL CITRATE (PF) 100 MCG/2ML IJ SOLN
INTRAMUSCULAR | Status: DC | PRN
  Administered 2015-12-23 (×2): 50 ug via INTRAVENOUS

## 2015-12-23 MED ORDER — ONDANSETRON HCL 4 MG/2ML IJ SOLN
INTRAMUSCULAR | Status: AC
  Filled 2015-12-23: qty 2

## 2015-12-23 MED ORDER — DEXAMETHASONE SODIUM PHOSPHATE 10 MG/ML IJ SOLN
INTRAMUSCULAR | Status: AC
  Filled 2015-12-23: qty 1

## 2015-12-23 MED ORDER — SCOPOLAMINE 1 MG/3DAYS TD PT72
1.0000 | MEDICATED_PATCH | Freq: Once | TRANSDERMAL | Status: DC
  Administered 2015-12-23: 1.5 mg via TRANSDERMAL

## 2015-12-23 MED ORDER — OXYCODONE-ACETAMINOPHEN 5-325 MG PO TABS: 1.0000 | ORAL_TABLET | ORAL | 0 refills | Status: DC | PRN

## 2015-12-23 SURGICAL SUPPLY — 14 items
CATH ROBINSON RED A/P 16FR (CATHETERS) ×2 IMPLANT
CONTAINER PREFILL 10% NBF 60ML (FORM) ×2 IMPLANT
GLOVE BIOGEL PI IND STRL 6.5 (GLOVE) ×1 IMPLANT
GLOVE BIOGEL PI IND STRL 7.0 (GLOVE) ×1 IMPLANT
GLOVE BIOGEL PI INDICATOR 6.5 (GLOVE) ×1
GLOVE BIOGEL PI INDICATOR 7.0 (GLOVE) ×1
GLOVE SURG SS PI 6.0 STRL IVOR (GLOVE) ×2 IMPLANT
GOWN STRL REUS W/TWL LRG LVL3 (GOWN DISPOSABLE) ×4 IMPLANT
PACK VAGINAL MINOR WOMEN LF (CUSTOM PROCEDURE TRAY) ×2 IMPLANT
PAD OB MATERNITY 4.3X12.25 (PERSONAL CARE ITEMS) ×2 IMPLANT
TOWEL OR 17X24 6PK STRL BLUE (TOWEL DISPOSABLE) ×4 IMPLANT
TUBING AQUILEX INFLOW (TUBING) ×2 IMPLANT
TUBING AQUILEX OUTFLOW (TUBING) ×2 IMPLANT
WATER STERILE IRR 1000ML POUR (IV SOLUTION) ×2 IMPLANT

## 2015-12-23 NOTE — Anesthesia Preprocedure Evaluation (Addendum)
Anesthesia Evaluation  Patient identified by MRN, date of birth, ID band Patient awake    Reviewed: Allergy & Precautions, NPO status , Patient's Chart, lab work & pertinent test results  Airway Mallampati: II  TM Distance: >3 FB Neck ROM: Full    Dental  (+) Teeth Intact, Dental Advisory Given   Pulmonary neg pulmonary ROS,    breath sounds clear to auscultation       Cardiovascular negative cardio ROS   Rhythm:Regular Rate:Normal     Neuro/Psych  Headaches, negative psych ROS   GI/Hepatic Neg liver ROS, GERD  ,  Endo/Other  negative endocrine ROS  Renal/GU negative Renal ROS  negative genitourinary   Musculoskeletal negative musculoskeletal ROS (+)   Abdominal   Peds negative pediatric ROS (+)  Hematology negative hematology ROS (+)   Anesthesia Other Findings   Reproductive/Obstetrics negative OB ROS                          Lab Results  Component Value Date   WBC 8.6 12/14/2015   HGB 11.2 (L) 12/14/2015   HCT 35.6 (L) 12/14/2015   MCV 70.2 (L) 12/14/2015   PLT 420 (H) 12/14/2015   Lab Results  Component Value Date   CREATININE 0.69 10/04/2015   BUN 9 10/04/2015   NA 138 10/04/2015   K 4.9 10/04/2015   CL 101 10/04/2015   CO2 24 10/04/2015   No results found for: INR, PROTIME   Anesthesia Physical Anesthesia Plan  ASA: II  Anesthesia Plan: General   Post-op Pain Management:    Induction: Intravenous  Airway Management Planned: LMA  Additional Equipment:   Intra-op Plan:   Post-operative Plan: Extubation in OR  Informed Consent: I have reviewed the patients History and Physical, chart, labs and discussed the procedure including the risks, benefits and alternatives for the proposed anesthesia with the patient or authorized representative who has indicated his/her understanding and acceptance.   Dental advisory given  Plan Discussed with: CRNA  Anesthesia  Plan Comments:         Anesthesia Quick Evaluation

## 2015-12-23 NOTE — Op Note (Signed)
PREOPERATIVE DIAGNOSIS:  Abnormal uterine bleeding. POSTOPERATIVE DIAGNOSIS: The same PROCEDURE: Hysteroscopy under ultrasound guidance SURGEON:  Dr. Mora Bellman   INDICATIONS: 40 y.o. [id number]  here for scheduled surgery for pre-op evaluation for possible [place].   Risks of surgery were discussed with the patient including but not limited to: bleeding which may require transfusion; infection which may require antibiotics; injury to uterus or surrounding organs; intrauterine scarring which may impair future fertility; need for additional procedures including laparotomy or laparoscopy; and other postoperative/anesthesia complications. Written informed consent was obtained.    FINDINGS:  A 10-week size uterus.    ANESTHESIA:   General, paracervical block. INTRAVENOUS FLUIDS:  1000 ml of LR FLUID DEFICITS:  260 ml of saline ESTIMATED BLOOD LOSS:  Less than 20 ml SPECIMENS: none COMPLICATIONS:  None immediate.  PROCEDURE DETAILS:  The patient was then taken to the operating room where general anesthesia was administered and was found to be adequate.  After an adequate timeout was performed, she was placed in the dorsal lithotomy position and examined; then prepped and draped in the sterile manner.   Her bladder was catheterized for an unmeasured amount of clear, yellow urine. A speculum was then placed in the patient's vagina and a single tooth tenaculum was applied to the anterior lip of the cervix.   A paracervical block using 20 ml of 0.5% Marcaine was administered.  The cervix could not be sounded. An order was placed for ultrasound tech to come in and help visualize the cervical canal. A large friboid was noted within the lower uterine segment. Cervical dilation was performed under ultrasound guidance. The hysteroscope was inserted under direct visualization and ultrasound guidance using saline as a suspension medium.  The uterine cavity was not visualized. It appears as though a false passage was  created within the fibroid. The procedure was aborted and her husband was notified. The tenaculum was removed from the anterior lip of the cervix and the vaginal speculum was removed after noting good hemostasis.  The patient tolerated the procedure well and was taken to the recovery area awake, extubated and in stable condition.  The patient has a history of regular monthly menses which are heavy in flow. Her menses lasted reportedly 4-5 days. She is at low risk for endometrial hyperplasia/malignancy. She is responding well to medical management with megace. Offered to continue megace or pursue [place] with [place] imaging. Patient will be seen in 2 weeks for post op check and review of management plan

## 2015-12-23 NOTE — Anesthesia Procedure Notes (Signed)
Procedure Name: LMA Insertion Date/Time: 12/23/2015 12:26 PM Performed by: Riki Sheer Pre-anesthesia Checklist: Patient identified, Emergency Drugs available, Suction available, Patient being monitored and Timeout performed Patient Re-evaluated:Patient Re-evaluated prior to inductionOxygen Delivery Method: Circle system utilized Preoxygenation: Pre-oxygenation with 100% oxygen Intubation Type: IV induction LMA Size: 4.0 Number of attempts: 1 Placement Confirmation: positive ETCO2,  CO2 detector and breath sounds checked- equal and bilateral Tube secured with: Tape Dental Injury: Teeth and Oropharynx as per pre-operative assessment

## 2015-12-23 NOTE — Anesthesia Postprocedure Evaluation (Signed)
Anesthesia Post Note  Patient: Felicia Roman  Procedure(s) Performed: Procedure(s) (LRB): HYSTEROSCOPY, INTRAOPERATIVE ULTRASOUND (N/A)  Patient location during evaluation: PACU Anesthesia Type: General Level of consciousness: awake and alert Pain management: pain level controlled Vital Signs Assessment: post-procedure vital signs reviewed and stable Respiratory status: spontaneous breathing, nonlabored ventilation, respiratory function stable and patient connected to nasal cannula oxygen Cardiovascular status: blood pressure returned to baseline and stable Postop Assessment: no signs of nausea or vomiting Anesthetic complications: no    Last Vitals:  Vitals:   12/23/15 1415 12/23/15 1430  BP: (!) 128/95 130/88  Pulse: 67 65  Resp: 16 16  Temp: 36.9 C 36.9 C    Last Pain:  Vitals:   12/23/15 1500  TempSrc:   PainSc: 1                  Jalan Bodi J

## 2015-12-23 NOTE — Discharge Instructions (Signed)
°  Post Anesthesia Home Care Instructions ° °Activity: °Get plenty of rest for the remainder of the day. A responsible adult should stay with you for 24 hours following the procedure.  °For the next 24 hours, DO NOT: °-Drive a car °-Operate machinery °-Drink alcoholic beverages °-Take any medication unless instructed by your physician °-Make any legal decisions or sign important papers. ° °Meals: °Start with liquid foods such as gelatin or soup. Progress to regular foods as tolerated. Avoid greasy, spicy, heavy foods. If nausea and/or vomiting occur, drink only clear liquids until the nausea and/or vomiting subsides. Call your physician if vomiting continues. ° °Special Instructions/Symptoms: °Your throat may feel dry or sore from the anesthesia or the breathing tube placed in your throat during surgery. If this causes discomfort, gargle with warm salt water. The discomfort should disappear within 24 hours. ° °If you had a scopolamine patch placed behind your ear for the management of post- operative nausea and/or vomiting: ° °1. The medication in the patch is effective for 72 hours, after which it should be removed.  Wrap patch in a tissue and discard in the trash. Wash hands thoroughly with soap and water. °2. You may remove the patch earlier than 72 hours if you experience unpleasant side effects which may include dry mouth, dizziness or visual disturbances. °3. Avoid touching the patch. Wash your hands with soap and water after contact with the patch. °  °DISCHARGE INSTRUCTIONS: D&C / D&E °The following instructions have been prepared to help you care for yourself upon your return home. °  °Personal hygiene: °• Use sanitary pads for vaginal drainage, not tampons. °• Shower the day after your procedure. °• NO tub baths, pools or Jacuzzis for 2-3 weeks. °• Wipe front to back after using the bathroom. ° °Activity and limitations: °• Do NOT drive or operate any equipment for 24 hours. The effects of anesthesia are  still present and drowsiness may result. °• Do NOT rest in bed all day. °• Walking is encouraged. °• Walk up and down stairs slowly. °• You may resume your normal activity in one to two days or as indicated by your physician. ° °Sexual activity: NO intercourse for at least 2 weeks after the procedure, or as indicated by your physician. ° °Diet: Eat a light meal as desired this evening. You may resume your usual diet tomorrow. ° °Return to work: You may resume your work activities in one to two days or as indicated by your doctor. ° °What to expect after your surgery: Expect to have vaginal bleeding/discharge for 2-3 days and spotting for up to 10 days. It is not unusual to have soreness for up to 1-2 weeks. You may have a slight burning sensation when you urinate for the first day. Mild cramps may continue for a couple of days. You may have a regular period in 2-6 weeks. ° °Call your doctor for any of the following: °• Excessive vaginal bleeding, saturating and changing one pad every hour. °• Inability to urinate 6 hours after discharge from hospital. °• Pain not relieved by pain medication. °• Fever of 100.4° F or greater. °• Unusual vaginal discharge or odor. ° ° Call for an appointment:  ° ° °Patient’s signature: ______________________ ° °Nurse’s signature ________________________ ° °Support person's signature_______________________ ° ° ° °

## 2015-12-23 NOTE — Transfer of Care (Signed)
Immediate Anesthesia Transfer of Care Note  Patient: Felicia Roman  Procedure(s) Performed: Procedure(s): HYSTEROSCOPY, INTRAOPERATIVE ULTRASOUND (N/A)  Patient Location: PACU  Anesthesia Type:General  Level of Consciousness: awake, alert  and oriented  Airway & Oxygen Therapy: Patient Spontanous Breathing and Patient connected to nasal cannula oxygen  Post-op Assessment: Report given to RN and Post -op Vital signs reviewed and stable  Post vital signs: Reviewed and stable  Last Vitals:  Vitals:   12/23/15 1110  BP: (!) 135/96  Pulse: 69  Resp: 18  Temp: 37 C    Last Pain:  Vitals:   12/23/15 1110  TempSrc: Oral      Patients Stated Pain Goal: 3 ([id number])  Complications: No apparent anesthesia complications

## 2015-12-24 ENCOUNTER — Encounter (HOSPITAL_COMMUNITY): Payer: Self-pay | Admitting: Obstetrics and Gynecology

## 2016-01-03 ENCOUNTER — Encounter: Payer: Self-pay | Admitting: Obstetrics and Gynecology

## 2016-01-03 ENCOUNTER — Ambulatory Visit (INDEPENDENT_AMBULATORY_CARE_PROVIDER_SITE_OTHER): Payer: Self-pay | Admitting: Obstetrics and Gynecology

## 2016-01-03 VITALS — BP 130/100 | HR 77 | Ht 64.0 in | Wt 159.0 lb

## 2016-01-03 DIAGNOSIS — Z9889 Other specified postprocedural states: Secondary | ICD-10-CM

## 2016-01-03 NOTE — Progress Notes (Signed)
40 yo here for post op check s/p attempted D&C with hysteroscopy. Patient reports doing well post op. Her bleeding has increased from daily spotting requiring 1 panty liner per day to now 2 panty liners per day. She denies any abdominal/pelvic pain  Past Medical History:  Diagnosis Date  . Cyst of soft tissue    both arms, head, left leg  . Dizziness   . Elbow pain   . GERD (gastroesophageal reflux disease)   . Headache    migraines   Past Surgical History:  Procedure Laterality Date  . HYSTEROSCOPY W/D&C N/A 12/23/2015   Procedure: HYSTEROSCOPY, INTRAOPERATIVE ULTRASOUND;  Surgeon: Mora Bellman, MD;  Location: [facility];  Service: Gynecology;  Laterality: N/A;  . WISDOM TOOTH EXTRACTION     Family History  Problem Relation Age of Onset  . Diabetes Mother   . Diabetes Father    Social History  Substance Use Topics  . Smoking status: Never Smoker  . Smokeless tobacco: Never Used  . Alcohol use No   ROS See pertinent in HPI  Blood pressure (!) 130/100, pulse 77, height 5\' 4"  (1.626 m), weight 159 lb (72.1 kg), last menstrual period 11/01/2015.  GENERAL: Well-developed, well-nourished female in no acute distress.  ABDOMEN: Soft, nontender, nondistended. No organomegaly. EXTREMITIES: No cyanosis, clubbing, or edema, 2+ distal pulses.    A/P 40 yo with fibroid uterus -Discussed attempted procedure - Patient with history of regular heavy monthly cycles now medically managed with Megace. Endometrial biopsy was requested by interventional radiology in preparation of possible [place]. Explained to the patient that I will not attempt to perform another biopsy or D&C as the location of the fibroid makes it very difficult to perform without risking uterine perforation and/or injury to surrounding organs. Patient verbalized understanding and is scheduled for [place] at the end of the month - RTC prn

## 2016-01-14 ENCOUNTER — Other Ambulatory Visit: Payer: Self-pay | Admitting: Radiology

## 2016-01-17 ENCOUNTER — Observation Stay (HOSPITAL_COMMUNITY)
Admission: RE | Admit: 2016-01-17 | Discharge: 2016-01-18 | Disposition: A | Discharge status: Home / Self Care | Payer: Medicaid Other | Source: Ambulatory Visit | Attending: Interventional Radiology | Admitting: Interventional Radiology

## 2016-01-17 ENCOUNTER — Ambulatory Visit (HOSPITAL_COMMUNITY)
Admission: RE | Admit: 2016-01-17 | Discharge: 2016-01-17 | Disposition: A | Discharge status: Home / Self Care | Payer: Medicaid Other | Source: Ambulatory Visit | Attending: Interventional Radiology | Admitting: Interventional Radiology

## 2016-01-17 ENCOUNTER — Encounter (HOSPITAL_COMMUNITY): Payer: Self-pay

## 2016-01-17 DIAGNOSIS — N939 Abnormal uterine and vaginal bleeding, unspecified: Secondary | ICD-10-CM | POA: Diagnosis present

## 2016-01-17 DIAGNOSIS — Z791 Long term (current) use of non-steroidal anti-inflammatories (NSAID): Secondary | ICD-10-CM | POA: Diagnosis not present

## 2016-01-17 DIAGNOSIS — D259 Leiomyoma of uterus, unspecified: Principal | ICD-10-CM | POA: Insufficient documentation

## 2016-01-17 DIAGNOSIS — Z720 Tobacco use: Secondary | ICD-10-CM | POA: Diagnosis not present

## 2016-01-17 DIAGNOSIS — Z79818 Long term (current) use of other agents affecting estrogen receptors and estrogen levels: Secondary | ICD-10-CM | POA: Diagnosis not present

## 2016-01-17 DIAGNOSIS — D219 Benign neoplasm of connective and other soft tissue, unspecified: Secondary | ICD-10-CM | POA: Diagnosis present

## 2016-01-17 HISTORY — PX: IR GENERIC HISTORICAL: IMG1180011

## 2016-01-17 LAB — CBC WITH DIFFERENTIAL/PLATELET
Basophils Absolute: 0 10*3/uL (ref 0.0–0.1)
Basophils Relative: 1 %
EOS ABS: 0.3 10*3/uL (ref 0.0–0.7)
Eosinophils Relative: 3 %
HCT: 34.6 % — ABNORMAL LOW (ref 36.0–46.0)
HEMOGLOBIN: 10.8 g/dL — AB (ref 12.0–15.0)
LYMPHS ABS: 3 10*3/uL (ref 0.7–4.0)
LYMPHS PCT: 34 %
MCH: 22.3 pg — AB (ref 26.0–34.0)
MCHC: 31.2 g/dL (ref 30.0–36.0)
MCV: 71.3 fL — AB (ref 78.0–100.0)
Monocytes Absolute: 0.7 10*3/uL (ref 0.1–1.0)
Monocytes Relative: 8 %
Neutro Abs: 4.8 10*3/uL (ref 1.7–7.7)
Neutrophils Relative %: 54 %
Platelets: 454 10*3/uL — ABNORMAL HIGH (ref 150–400)
RBC: 4.85 MIL/uL (ref 3.87–5.11)
RDW: 16.5 % — ABNORMAL HIGH (ref 11.5–15.5)
WBC: 8.8 10*3/uL (ref 4.0–10.5)

## 2016-01-17 LAB — BASIC METABOLIC PANEL
Anion gap: 6 (ref 5–15)
BUN: 11 mg/dL (ref 6–20)
CHLORIDE: 110 mmol/L (ref 101–111)
CO2: 22 mmol/L (ref 22–32)
CREATININE: 0.71 mg/dL (ref 0.44–1.00)
Calcium: 8.9 mg/dL (ref 8.9–10.3)
GFR calc Af Amer: >60 mL/min (ref >60)
GFR calc non Af Amer: >60 mL/min (ref >60)
GLUCOSE: 89 mg/dL (ref 65–99)
POTASSIUM: 3.6 mmol/L (ref 3.5–5.1)
Sodium: 138 mmol/L (ref 135–145)

## 2016-01-17 LAB — PROTIME-INR
INR: 1.04
Prothrombin Time: 13.6 seconds (ref 11.4–15.2)

## 2016-01-17 LAB — HCG, SERUM, QUALITATIVE: PREG SERUM: NEGATIVE

## 2016-01-17 MED ORDER — HYDROMORPHONE HCL 2 MG/ML IJ SOLN
INTRAMUSCULAR | Status: AC
  Filled 2016-01-17: qty 1

## 2016-01-17 MED ORDER — SODIUM CHLORIDE 0.9% FLUSH: 9.0000 mL | INTRAVENOUS | Status: DC | PRN

## 2016-01-17 MED ORDER — PROMETHAZINE HCL 25 MG RE SUPP
25.0000 mg | Freq: Three times a day (TID) | RECTAL | Status: DC | PRN
Start: 2016-01-17 — End: 2016-01-18
  Filled 2016-01-17: qty 1

## 2016-01-17 MED ORDER — SODIUM CHLORIDE 0.9% FLUSH: 3.0000 mL | INTRAVENOUS | Status: DC | PRN

## 2016-01-17 MED ORDER — KETOROLAC TROMETHAMINE 30 MG/ML IJ SOLN
30.0000 mg | Freq: Four times a day (QID) | INTRAMUSCULAR | Status: DC
  Administered 2016-01-17 – 2016-01-18 (×4): 30 mg via INTRAVENOUS
  Filled 2016-01-17 (×4): qty 1

## 2016-01-17 MED ORDER — CHLORHEXIDINE GLUCONATE 0.12 % MT SOLN
15.0000 mL | Freq: Two times a day (BID) | OROMUCOSAL | Status: DC
  Administered 2016-01-18 (×2): 15 mL via OROMUCOSAL
  Filled 2016-01-17 (×2): qty 15

## 2016-01-17 MED ORDER — HYDROMORPHONE HCL 1 MG/ML IJ SOLN
INTRAMUSCULAR | Status: DC | PRN
  Administered 2016-01-17: 1 mg via INTRAVENOUS

## 2016-01-17 MED ORDER — IOPAMIDOL (ISOVUE-300) INJECTION 61%
80.0000 mL | Freq: Once | INTRAVENOUS | Status: AC | PRN
  Administered 2016-01-17: 150 mL via INTRA_ARTERIAL

## 2016-01-17 MED ORDER — SODIUM CHLORIDE 0.9 % IV SOLN
INTRAVENOUS | Status: DC
  Administered 2016-01-17: 08:00:00 via INTRAVENOUS

## 2016-01-17 MED ORDER — DIPHENHYDRAMINE HCL 12.5 MG/5ML PO ELIX
12.5000 mg | ORAL_SOLUTION | Freq: Four times a day (QID) | ORAL | Status: DC | PRN
  Filled 2016-01-17: qty 5

## 2016-01-17 MED ORDER — MEGESTROL ACETATE 40 MG PO TABS
40.0000 mg | ORAL_TABLET | Freq: Three times a day (TID) | ORAL | Status: DC
  Administered 2016-01-17 – 2016-01-18 (×2): 40 mg via ORAL
  Filled 2016-01-17 (×4): qty 1

## 2016-01-17 MED ORDER — HYDROMORPHONE 1 MG/ML IV SOLN
INTRAVENOUS | Status: AC
Start: 2016-01-17 — End: 2016-01-17
  Filled 2016-01-17: qty 25

## 2016-01-17 MED ORDER — ONDANSETRON HCL 4 MG/2ML IJ SOLN
4.0000 mg | Freq: Four times a day (QID) | INTRAMUSCULAR | Status: DC | PRN
Start: 2016-01-17 — End: 2016-01-18
  Administered 2016-01-18: 4 mg via INTRAVENOUS
  Filled 2016-01-17 (×2): qty 2

## 2016-01-17 MED ORDER — DIPHENHYDRAMINE HCL 50 MG/ML IJ SOLN: 12.5000 mg | Freq: Four times a day (QID) | INTRAMUSCULAR | Status: DC | PRN

## 2016-01-17 MED ORDER — CEFAZOLIN SODIUM-DEXTROSE 2-4 GM/100ML-% IV SOLN
2.0000 g | INTRAVENOUS | Status: AC
  Administered 2016-01-17: 2 g via INTRAVENOUS
  Filled 2016-01-17: qty 100

## 2016-01-17 MED ORDER — SODIUM CHLORIDE 0.9% FLUSH
3.0000 mL | Freq: Two times a day (BID) | INTRAVENOUS | Status: DC
  Administered 2016-01-17: 3 mL via INTRAVENOUS

## 2016-01-17 MED ORDER — LIDOCAINE HCL 1 % IJ SOLN
INTRAMUSCULAR | Status: AC
  Filled 2016-01-17: qty 20

## 2016-01-17 MED ORDER — LIDOCAINE HCL 1 % IJ SOLN
INTRAMUSCULAR | Status: DC | PRN
  Administered 2016-01-17: 5 mL

## 2016-01-17 MED ORDER — PROMETHAZINE HCL 25 MG PO TABS: 25.0000 mg | ORAL_TABLET | Freq: Three times a day (TID) | ORAL | Status: DC | PRN

## 2016-01-17 MED ORDER — ONDANSETRON HCL 4 MG/2ML IJ SOLN
4.0000 mg | Freq: Four times a day (QID) | INTRAMUSCULAR | Status: DC | PRN
  Administered 2016-01-17 (×2): 4 mg via INTRAVENOUS
  Filled 2016-01-17 (×2): qty 2

## 2016-01-17 MED ORDER — DEXAMETHASONE SODIUM PHOSPHATE 10 MG/ML IJ SOLN
INTRAMUSCULAR | Status: AC
  Filled 2016-01-17: qty 1

## 2016-01-17 MED ORDER — MIDAZOLAM HCL 2 MG/2ML IJ SOLN
INTRAMUSCULAR | Status: DC | PRN
  Administered 2016-01-17 (×2): 1 mg via INTRAVENOUS
  Administered 2016-01-17: 0.5 mg via INTRAVENOUS
  Administered 2016-01-17: 1 mg via INTRAVENOUS

## 2016-01-17 MED ORDER — KETOROLAC TROMETHAMINE 30 MG/ML IJ SOLN
30.0000 mg | INTRAMUSCULAR | Status: AC
  Administered 2016-01-17: 30 mg via INTRAVENOUS
  Filled 2016-01-17: qty 1

## 2016-01-17 MED ORDER — FENTANYL CITRATE (PF) 100 MCG/2ML IJ SOLN
INTRAMUSCULAR | Status: DC | PRN
  Administered 2016-01-17 (×3): 50 ug via INTRAVENOUS
  Administered 2016-01-17: 25 ug via INTRAVENOUS

## 2016-01-17 MED ORDER — HYDROMORPHONE 1 MG/ML IV SOLN
INTRAVENOUS | Status: DC
  Administered 2016-01-17: 0.9 mg via INTRAVENOUS
  Administered 2016-01-17: 2.5 mg via INTRAVENOUS
  Administered 2016-01-17 (×2): 0.5 mg via INTRAVENOUS
  Administered 2016-01-18: 0.3 mg via INTRAVENOUS

## 2016-01-17 MED ORDER — FENTANYL CITRATE (PF) 100 MCG/2ML IJ SOLN
INTRAMUSCULAR | Status: AC
  Filled 2016-01-17: qty 6

## 2016-01-17 MED ORDER — KETOROLAC TROMETHAMINE 30 MG/ML IJ SOLN
INTRAMUSCULAR | Status: AC
  Filled 2016-01-17: qty 1

## 2016-01-17 MED ORDER — MIDAZOLAM HCL 2 MG/2ML IJ SOLN
INTRAMUSCULAR | Status: AC
  Filled 2016-01-17: qty 6

## 2016-01-17 MED ORDER — DOCUSATE SODIUM 100 MG PO CAPS
100.0000 mg | ORAL_CAPSULE | Freq: Two times a day (BID) | ORAL | Status: DC
  Administered 2016-01-18: 100 mg via ORAL
  Filled 2016-01-17: qty 1

## 2016-01-17 MED ORDER — ORAL CARE MOUTH RINSE: 15.0000 mL | Freq: Two times a day (BID) | OROMUCOSAL | Status: DC

## 2016-01-17 MED ORDER — NALOXONE HCL 0.4 MG/ML IJ SOLN: 0.4000 mg | INTRAMUSCULAR | Status: DC | PRN

## 2016-01-17 MED ORDER — SODIUM CHLORIDE 0.9 % IV SOLN: 250.0000 mL | INTRAVENOUS | Status: DC | PRN

## 2016-01-17 NOTE — Procedures (Signed)
Post UFE.   No immediate post procedural complications.   EBL: None Keep right leg straight for 4 hrs.    SignedSandi Mariscal [id number] 01/17/2016, 11:44 AM

## 2016-01-17 NOTE — Discharge Instructions (Addendum)
Uterine Artery Embolization for Fibroids, Care After °Refer to this sheet in the next few weeks. These instructions provide you with information on caring for yourself after your procedure. Your health care provider may also give you more specific instructions. Your treatment has been planned according to current medical practices, but problems sometimes occur. Call your health care provider if you have any problems or questions after your procedure. °WHAT TO EXPECT AFTER THE PROCEDURE °After your procedure, it is typical to have cramping in the pelvis. You will be given pain medicine to control it. °HOME CARE INSTRUCTIONS °· Only take over-the-counter or prescription medicines for pain, discomfort, or fever as directed by your health care provider. °· Do not take aspirin. It can cause bleeding. °· Follow your health care provider's advice regarding medicines given to you, diet, activity, and when to begin sexual activity. °· See your health care provider for follow-up care as directed. °SEEK MEDICAL CARE IF: °· You have a fever. °· You have redness, swelling, and pain around your incision site. °· You have pus draining from your incision. °· You have a rash. °SEEK IMMEDIATE MEDICAL CARE IF: °· You have bleeding from your incision site. °· You have difficulty breathing. °· You have chest pain. °· You have severe abdominal pain. °· You have leg pain. °· You become dizzy and faint. °  °This information is not intended to replace advice given to you by your health care provider. Make sure you discuss any questions you have with your health care provider. °  °Document Released: 02/26/2013 Document Reviewed: 02/26/2013 °Elsevier Interactive Patient Education ©2016 [employer] ° °

## 2016-01-17 NOTE — H&P (Signed)
Chief Complaint: large uterine fibroid  Referring Physician:Dr. Mora Bellman  Supervising Physician: Sandi Mariscal  Patient Status:  Out-pt  HPI: Felicia Roman is an 40 y.o. female well-known to Dr. Pascal Lux as he has seen her 3 times in the office prior to this procedure.  She has a large uterine fibroid with irregular/heavy bleeding.  She initially wanted to undergo a hysterectomy, but things changed and she has decided on a [place].  She recently underwent a hysteroscopy to try and obtain an endometrial biopsy, but due to the location, was unable to obtain the biopsy.  She has no new complaints and presents today for her [place].   Past Medical History:  Past Medical History:  Diagnosis Date  . Cyst of soft tissue    both arms, head, left leg  . Dizziness   . Elbow pain   . GERD (gastroesophageal reflux disease)   . Headache    migraines    Past Surgical History:  Past Surgical History:  Procedure Laterality Date  . HYSTEROSCOPY W/D&C N/A 12/23/2015   Procedure: HYSTEROSCOPY, INTRAOPERATIVE ULTRASOUND;  Surgeon: Mora Bellman, MD;  Location: [facility];  Service: Gynecology;  Laterality: N/A;  . WISDOM TOOTH EXTRACTION      Family History:  Family History  Problem Relation Age of Onset  . Diabetes Mother   . Diabetes Father     Social History:  reports that she has never smoked. She has never used smokeless tobacco. She reports that she does not drink alcohol or use drugs.  Allergies: No Known Allergies  Medications: Medications reviewed in Epic  Please HPI for pertinent positives, otherwise complete 10 system ROS negative.  Mallampati Score: MD Evaluation Airway: WNL Heart: WNL Abdomen: WNL Chest/ Lungs: WNL ASA  Classification: 2 Mallampati/Airway Score: One  Physical Exam: BP (!) 137/97 (BP Location: Left Arm)   Pulse 74   Temp 98 F (36.7 C) (Oral)   Ht 5' 4"  (1.626 m)   LMP 12/01/2015 (Within Weeks)   SpO2 100%  There is no height or weight on file to  calculate BMI. General: pleasant, WD, WN female who is laying in bed in NAD HEENT: head is normocephalic, atraumatic.  Sclera are noninjected.  PERRL.  Ears and nose without any masses or lesions.  Mouth is pink and moist Heart: regular, rate, and rhythm.  Normal s1,s2. No obvious murmurs, gallops, or rubs noted.  Palpable radial and pedal pulses bilaterally Lungs: CTAB, no wheezes, rhonchi, or rales noted.  Respiratory effort nonlabored Abd: soft, NT, slightly poochy, +BS, no masses, hernias, or organomegaly MS: all 4 extremities are symmetrical with no cyanosis, clubbing, or edema. Psych: A&Ox3 with an appropriate affect.   Labs: Results for orders placed or performed during the hospital encounter of 01/17/16 (from the past 48 hour(s))  Basic metabolic panel     Status: None   Collection Time: 01/17/16  8:20 AM  Result Value Ref Range   Sodium 138 135 - 145 mmol/L   Potassium 3.6 3.5 - 5.1 mmol/L   Chloride 110 101 - 111 mmol/L   CO2 22 22 - 32 mmol/L   Glucose, Bld 89 65 - 99 mg/dL   BUN 11 6 - 20 mg/dL   Creatinine, Ser 0.71 0.44 - 1.00 mg/dL   Calcium 8.9 8.9 - 10.3 mg/dL   GFR calc non Af Amer >60 >60 mL/min   GFR calc Af Amer >60 >60 mL/min    Comment: (NOTE) The eGFR has been calculated using  the CKD EPI equation. This calculation has not been validated in all clinical situations. eGFR's persistently <60 mL/min signify possible Chronic Kidney Disease.    Anion gap 6 5 - 15  CBC with Differential/Platelet     Status: Abnormal   Collection Time: 01/17/16  8:20 AM  Result Value Ref Range   WBC 8.8 4.0 - 10.5 K/uL   RBC 4.85 3.87 - 5.11 MIL/uL   Hemoglobin 10.8 (L) 12.0 - 15.0 g/dL   HCT 34.6 (L) 36.0 - 46.0 %   MCV 71.3 (L) 78.0 - 100.0 fL   MCH 22.3 (L) 26.0 - 34.0 pg   MCHC 31.2 30.0 - 36.0 g/dL   RDW 16.5 (H) 11.5 - 15.5 %   Platelets 454 (H) 150 - 400 K/uL   Neutrophils Relative % 54 %   Neutro Abs 4.8 1.7 - 7.7 K/uL   Lymphocytes Relative 34 %   Lymphs Abs 3.0  0.7 - 4.0 K/uL   Monocytes Relative 8 %   Monocytes Absolute 0.7 0.1 - 1.0 K/uL   Eosinophils Relative 3 %   Eosinophils Absolute 0.3 0.0 - 0.7 K/uL   Basophils Relative 1 %   Basophils Absolute 0.0 0.0 - 0.1 K/uL  Protime-INR     Status: None   Collection Time: 01/17/16  8:20 AM  Result Value Ref Range   Prothrombin Time 13.6 11.4 - 15.2 seconds   INR 1.04     Imaging: No results found.  Assessment/Plan 1. dysmenorrhea from symptomatic uterine fibroids -we will plan to proceed with a [place] today -the patient's labs and vitals have been reviewed -she will be admitted following the procedure for pain and nausea control and hopefully DC home in the am -Risks and Benefits discussed with the patient including, but not limited to bleeding, infection, vascular injury or contrast induced renal failure. All of the patient's questions were answered, patient is agreeable to proceed. Consent signed and in chart.  Thank you for this interesting consult.  I greatly enjoyed meeting Felicia Roman and look forward to participating in their care.  A copy of this report was sent to the requesting provider on this date.  Electronically Signed: Henreitta Cea 01/17/2016, 9:05 AM   I spent a total of    25 Minutes in face to face in clinical consultation, greater than 50% of which was counseling/coordinating care for uterine fibroids

## 2016-01-18 ENCOUNTER — Other Ambulatory Visit: Payer: Self-pay | Admitting: Radiology

## 2016-01-18 DIAGNOSIS — D259 Leiomyoma of uterus, unspecified: Secondary | ICD-10-CM | POA: Diagnosis not present

## 2016-01-18 MED ORDER — HYDROMORPHONE 1 MG/ML IV SOLN
INTRAVENOUS | Status: DC
  Administered 2016-01-18: 1.5 mg via INTRAVENOUS
  Administered 2016-01-18: 1.8 mg via INTRAVENOUS

## 2016-01-18 MED ORDER — SODIUM CHLORIDE 0.9% FLUSH: 9.0000 mL | INTRAVENOUS | Status: DC | PRN

## 2016-01-18 MED ORDER — ONDANSETRON HCL 4 MG/2ML IJ SOLN
4.0000 mg | Freq: Four times a day (QID) | INTRAMUSCULAR | Status: DC | PRN
Start: 2016-01-18 — End: 2016-01-18

## 2016-01-18 MED ORDER — DIPHENHYDRAMINE HCL 50 MG/ML IJ SOLN: 12.5000 mg | Freq: Four times a day (QID) | INTRAMUSCULAR | Status: DC | PRN

## 2016-01-18 MED ORDER — NALOXONE HCL 0.4 MG/ML IJ SOLN: 0.4000 mg | INTRAMUSCULAR | Status: DC | PRN

## 2016-01-18 MED ORDER — HYDROCODONE-ACETAMINOPHEN 5-325 MG PO TABS
1.0000 | ORAL_TABLET | ORAL | Status: DC | PRN
  Administered 2016-01-18: 1 via ORAL
  Filled 2016-01-18: qty 1

## 2016-01-18 MED ORDER — NALOXONE HCL 0.4 MG/ML IJ SOLN
0.4000 mg | INTRAMUSCULAR | Status: DC | PRN
Start: 2016-01-18 — End: 2016-01-18

## 2016-01-18 MED ORDER — DIPHENHYDRAMINE HCL 12.5 MG/5ML PO ELIX: 12.5000 mg | ORAL_SOLUTION | Freq: Four times a day (QID) | ORAL | Status: DC | PRN

## 2016-01-18 MED ORDER — ONDANSETRON HCL 4 MG/2ML IJ SOLN: 4.0000 mg | Freq: Four times a day (QID) | INTRAMUSCULAR | Status: DC | PRN

## 2016-01-18 MED ORDER — HYDROMORPHONE HCL 1 MG/ML IJ SOLN
1.0000 mg | Freq: Once | INTRAMUSCULAR | Status: AC
Start: 2016-01-18 — End: 2016-01-18
  Administered 2016-01-18: 1 mg via INTRAVENOUS
  Filled 2016-01-18: qty 1

## 2016-01-18 MED ORDER — SODIUM CHLORIDE 0.9 % IV SOLN
25.0000 mg/h | INTRAVENOUS | Status: DC
Start: 2016-01-18 — End: 2016-01-18

## 2016-01-18 NOTE — Progress Notes (Signed)
Denies any vaginal bleeding. Right groin  No bleeding or hematoma, with positive pedaL PULSES.dRESSING DRY AND INTACT.

## 2016-01-18 NOTE — Progress Notes (Signed)
Patient ID: Felicia Roman, female   DOB: 29-Jan-1976, 40 y.o.   MRN: [id number] Pt c/o nausea/pelvic cramping; has only taken few small sips liquids; has voided VSS;AF drowsy but arousable; chest- CTA bilat; heart- RRR; abd- soft,+BS, mildly tender pelvic region; puncture site rt CFA soft, no hematoma; intact distal pulses S/p UFE 8/28; d/c dilaudid PCA; norco for pain prn; zofran prn nausea; advance diet as tolerated; ambulate; will reeval later this am F/u in IR clinic in 3-4 weeks   K Curley Hogen,PAC

## 2016-01-18 NOTE — Progress Notes (Signed)
Nursing Note: Pt c/o pain in L groin.A: paged on-call and were aware and state this is not new for pt and that this was present yesterday.wbb

## 2016-01-18 NOTE — Discharge Summary (Signed)
Patient ID: Felicia Roman MRN: [id number] DOB/AGE: October 20, 1975 40 y.o.  Admit date: 01/17/2016 Discharge date: 01/18/2016  Supervising Physician: Sandi Mariscal  Admission Diagnoses: Symptomatic large subserosal uterine fibroid  Discharge Diagnoses: Symptomatic large subserosal uterine fibroid, status post successful bilateral uterine artery embolization on 01/17/16 Active Problems:   Fibroid  Past Medical History:  Diagnosis Date  . Cyst of soft tissue    both arms, head, left leg  . Dizziness   . Elbow pain   . GERD (gastroesophageal reflux disease)   . Headache    migraines   Past Surgical History:  Procedure Laterality Date  . HYSTEROSCOPY W/D&C N/A 12/23/2015   Procedure: HYSTEROSCOPY, INTRAOPERATIVE ULTRASOUND;  Surgeon: Mora Bellman, MD;  Location: [facility];  Service: Gynecology;  Laterality: N/A;  . IR GENERIC HISTORICAL  01/17/2016   IR ANGIOGRAM SELECTIVE EACH ADDITIONAL VESSEL 01/17/2016 Sandi Mariscal, MD [facility]  . IR GENERIC HISTORICAL  01/17/2016   IR US GUIDE VASC ACCESS RIGHT 01/17/2016 Sandi Mariscal, MD [facility]  . IR GENERIC HISTORICAL  01/17/2016   IR ANGIOGRAM PELVIS SELECTIVE OR SUPRASELECTIVE 01/17/2016 Sandi Mariscal, MD [facility]  . IR GENERIC HISTORICAL  01/17/2016   IR ANGIOGRAM PELVIS SELECTIVE OR SUPRASELECTIVE 01/17/2016 Sandi Mariscal, MD [facility]  . IR GENERIC HISTORICAL  01/17/2016   IR EMBO TUMOR ORGAN ISCHEMIA INFARCT INC GUIDE ROADMAPPING 01/17/2016 Sandi Mariscal, MD [facility]  . IR GENERIC HISTORICAL  01/17/2016   IR ANGIOGRAM SELECTIVE EACH ADDITIONAL VESSEL 01/17/2016 Sandi Mariscal, MD [facility]  . WISDOM TOOTH EXTRACTION        Discharged Condition: good  Hospital Course: Felicia Roman is a 40 year old female who was referred to the IR service on 09/09/15 for consultation regarding treatment options for symptomatic large subserosal uterine fibroid. On 01/17/16 she underwent successful bilateral uterine artery embolization by Dr. Pascal Lux. The  procedure was performed without immediate complications and she was subsequently admitted for overnight observation for pain control. Postprocedure she did experience expected intermittent pelvic cramping along with some occasional nausea and vomiting. She was placed on a Dilaudid PCA pump and given antiemetics as needed. On the day of discharge the patient was stable. She was able to void, ambulate and tolerate liquids and crackers without significant difficulty. She did continue to have some intermittent pelvic cramping and was told this would persist but should gradually improve over time. She was seen by Dr. Laurence Ferrari and deemed stable for discharge at this time. She was given prescriptions for ibuprofen 600 mg, #20, no refills, 1 tablet every 6 hours for the next 5 days, Colace 100 mg, #20, no refills, 1 tablet twice daily as needed for constipation, Norco 5/325, #30, no refills, 1-2 tablets every 4-6 hours as needed for moderate to severe pain, Zofran 8 mg, #20, no refills, 1 tablet twice daily as needed for nausea and Phenergan 25 mg, #10, no refills, 1 tablet every 6 hours as needed for nausea. She will continue on her current home medications. She will be scheduled for follow-up in the IR clinic with Dr. Pascal Lux in 3-4 weeks. She will continue current gynecologic care with Dr. Elly Modena. She was told to contact our service with any additional questions or concerns.  Consults: none  Significant Diagnostic Studies:  Results for orders placed or performed during the hospital encounter of [id number]  Basic metabolic panel  Result Value Ref Range   Sodium 138 135 - 145 mmol/L   Potassium 3.6 3.5 - 5.1 mmol/L  Chloride 110 101 - 111 mmol/L   CO2 22 22 - 32 mmol/L   Glucose, Bld 89 65 - 99 mg/dL   BUN 11 6 - 20 mg/dL   Creatinine, Ser 0.71 0.44 - 1.00 mg/dL   Calcium 8.9 8.9 - 10.3 mg/dL   GFR calc non Af Amer >60 >60 mL/min   GFR calc Af Amer >60 >60 mL/min   Anion gap 6 5 - 15  CBC with  Differential/Platelet  Result Value Ref Range   WBC 8.8 4.0 - 10.5 K/uL   RBC 4.85 3.87 - 5.11 MIL/uL   Hemoglobin 10.8 (L) 12.0 - 15.0 g/dL   HCT 34.6 (L) 36.0 - 46.0 %   MCV 71.3 (L) 78.0 - 100.0 fL   MCH 22.3 (L) 26.0 - 34.0 pg   MCHC 31.2 30.0 - 36.0 g/dL   RDW 16.5 (H) 11.5 - 15.5 %   Platelets 454 (H) 150 - 400 K/uL   Neutrophils Relative % 54 %   Neutro Abs 4.8 1.7 - 7.7 K/uL   Lymphocytes Relative 34 %   Lymphs Abs 3.0 0.7 - 4.0 K/uL   Monocytes Relative 8 %   Monocytes Absolute 0.7 0.1 - 1.0 K/uL   Eosinophils Relative 3 %   Eosinophils Absolute 0.3 0.0 - 0.7 K/uL   Basophils Relative 1 %   Basophils Absolute 0.0 0.0 - 0.1 K/uL  hCG, serum, qualitative  Result Value Ref Range   Preg, Serum NEGATIVE NEGATIVE  Protime-INR  Result Value Ref Range   Prothrombin Time 13.6 11.4 - 15.2 seconds   INR 1.04      Treatments: successful bilateral uterine artery embolization via IV conscious sedation on 01/17/16  Discharge Exam: Blood pressure (!) (P) 147/89, pulse (P) 74, temperature (P) 98.2 F (36.8 C), resp. rate (P) 16, height 5\' 4"  (1.626 m), last menstrual period 12/01/2015, SpO2 99 %. Awake/alert; chest- CTA bilat; heart- RRR; abd- soft,+BS, mildly tender mid/ left pelvic regions; rt CFA puncture site soft,NT, no hematoma; intact distal pulses, no LE edema Disposition: home  Discharge Instructions    Call MD for:  difficulty breathing, headache or visual disturbances    Complete by:  As directed   Call MD for:  extreme fatigue    Complete by:  As directed   Call MD for:  hives    Complete by:  As directed   Call MD for:  persistant dizziness or light-headedness    Complete by:  As directed   Call MD for:  persistant nausea and vomiting    Complete by:  As directed   Call MD for:  redness, tenderness, or signs of infection (pain, swelling, redness, odor or green/yellow discharge around incision site)    Complete by:  As directed   Call MD for:  severe uncontrolled pain     Complete by:  As directed   Call MD for:  temperature >100.4    Complete by:  As directed   Change dressing (specify)    Complete by:  As directed   May change bandage over right groin region and apply new Band-Aid to site for the next 2-3 days. May wash site with soap and water.   Diet - low sodium heart healthy    Complete by:  As directed   Discharge instructions    Complete by:  As directed   Stay well hydrated; may resume home medications   Driving Restrictions    Complete by:  As directed   No  driving for next 48 hours   Increase activity slowly    Complete by:  As directed   Lifting restrictions    Complete by:  As directed   No heavy lifting for the next 3-4 days   May shower / Bathe    Complete by:  As directed   May walk up steps    Complete by:  As directed   Sexual Activity Restrictions    Complete by:  As directed   No sexual intercourse for 1 week       Medication List    TAKE these medications   ibuprofen 600 MG tablet Commonly known as:  ADVIL,MOTRIN Take 1 tablet (600 mg total) by mouth every 6 (six) hours as needed. What changed:  reasons to take this   megestrol 40 MG tablet Commonly known as:  MEGACE Take 1 tablet (40 mg total) by mouth 3 (three) times daily.      Follow-up Information    Sandi Mariscal, MD .   Specialty:  Interventional Radiology Why:  Radiology will call you with follow up appointment with Dr. Pascal Lux in the IR clinic in 3-4 weeks; please call [phone] or [phone] with any questions. Contact information: [address] [place] [address] [phone]        CONSTANT,PEGGY, MD .   Specialty:  Obstetrics and Gynecology Why:  continue current gynecologic care with Dr. Elly Modena as scheduled Contact information: [place] [address] [phone]            Electronically Signed: D. Rowe Robert 01/18/2016, 12:28 PM   I have spent less than 30 minutes discharging Nelson.

## 2016-01-18 NOTE — Discharge Instructions (Signed)
Uterine Artery Embolization for Fibroids, Care After Refer to this sheet in the next few weeks. These instructions provide you with information on caring for yourself after your procedure. Your health care provider may also give you more specific instructions. Your treatment has been planned according to current medical practices, but problems sometimes occur. Call your health care provider if you have any problems or questions after your procedure. WHAT TO EXPECT AFTER THE PROCEDURE After your procedure, it is typical to have cramping in the pelvis. You will be given pain medicine to control it. HOME CARE INSTRUCTIONS  Only take over-the-counter or prescription medicines for pain, discomfort, or fever as directed by your health care provider.  Do not take aspirin. It can cause bleeding.  Follow your health care provider's advice regarding medicines given to you, diet, activity, and when to begin sexual activity.  See your health care provider for follow-up care as directed. SEEK MEDICAL CARE IF:  You have a fever.  You have redness, swelling, and pain around your incision site.  You have pus draining from your incision.  You have a rash. SEEK IMMEDIATE MEDICAL CARE IF:  You have bleeding from your incision site.  You have difficulty breathing.  You have chest pain.  You have severe abdominal pain.  You have leg pain.  You become dizzy and faint.   This information is not intended to replace advice given to you by your health care provider. Make sure you discuss any questions you have with your health care provider.   Document Released: 02/26/2013 Document Reviewed: 02/26/2013 [employer] Interactive Patient Education 2016 [place]. Uterine Artery Embolization for Fibroids, Care After Refer to this sheet in the next few weeks. These instructions provide you with information on caring for yourself after your procedure. Your health care provider may also give you more  specific instructions. Your treatment has been planned according to current medical practices, but problems sometimes occur. Call your health care provider if you have any problems or questions after your procedure. WHAT TO EXPECT AFTER THE PROCEDURE After your procedure, it is typical to have cramping in the pelvis. You will be given pain medicine to control it. HOME CARE INSTRUCTIONS  Only take over-the-counter or prescription medicines for pain, discomfort, or fever as directed by your health care provider.  Do not take aspirin. It can cause bleeding.  Follow your health care provider's advice regarding medicines given to you, diet, activity, and when to begin sexual activity.  See your health care provider for follow-up care as directed. SEEK MEDICAL CARE IF:  You have a fever.  You have redness, swelling, and pain around your incision site.  You have pus draining from your incision.  You have a rash. SEEK IMMEDIATE MEDICAL CARE IF:  You have bleeding from your incision site.  You have difficulty breathing.  You have chest pain.  You have severe abdominal pain.  You have leg pain.  You become dizzy and faint.   This information is not intended to replace advice given to you by your health care provider. Make sure you discuss any questions you have with your health care provider.   Document Released: 02/26/2013 Document Reviewed: 02/26/2013 [employer] Interactive Patient Education [employer].

## 2016-02-15 ENCOUNTER — Ambulatory Visit
Admission: RE | Admit: 2016-02-15 | Discharge: 2016-02-15 | Disposition: A | Discharge status: Home / Self Care | Payer: No Typology Code available for payment source | Source: Ambulatory Visit | Attending: Radiology | Admitting: Radiology

## 2016-02-15 DIAGNOSIS — D259 Leiomyoma of uterus, unspecified: Secondary | ICD-10-CM

## 2016-02-15 HISTORY — PX: IR GENERIC HISTORICAL: IMG1180011

## 2016-02-15 NOTE — Progress Notes (Signed)
Patient ID: Felicia Roman, female   DOB: March 22, 1976, 40 y.o.   MRN: [id number]         Chief Complaint: Post uterine fibroid embolization.  Referring Physician(s): Constant  History of Present Illness: Felicia Roman is a 40 y.o. (G4, P2) female with no significant past medical history who returns at interventional radiology clinic for postprocedural evaluation following technically successful bilateral uterine artery embolization performed 01/17/2016. She is again accompanied by her husband who serves as her Optometrist.  The patient has recovered completely from the procedure with no immediate or delayed post procedural complications.  Patient continues to report near daily vaginal spotting though denies the passage of any tissue. No fever or chills. Her discharge is not foul-smelling. She continues to complain of constipation and urinary frequency. She denies flank pain.  Patient reports intermittent pelvic pain which is worse at the time of heavy or spotting.  She reports the pelvic pain as mild located within the anterior aspect of her lower pelvis bilaterally. She is not taking any pain medication for this pelvic pain.  Given her near daily spotting, the patient is uncertain whether he has experienced a menstrual cycle since the procedure. Patient continues to take her prescribed dose of Megestrol AC 40 mg twice a day.   Past Medical History:  Diagnosis Date  . Cyst of soft tissue    both arms, head, left leg  . Dizziness   . Elbow pain   . GERD (gastroesophageal reflux disease)   . Headache    migraines    Past Surgical History:  Procedure Laterality Date  . HYSTEROSCOPY W/D&C N/A 12/23/2015   Procedure: HYSTEROSCOPY, INTRAOPERATIVE ULTRASOUND;  Surgeon: Mora Bellman, MD;  Location: [facility];  Service: Gynecology;  Laterality: N/A;  . IR GENERIC HISTORICAL  01/17/2016   IR ANGIOGRAM SELECTIVE EACH ADDITIONAL VESSEL 01/17/2016 Sandi Mariscal, MD [facility]  . IR GENERIC  HISTORICAL  01/17/2016   IR US GUIDE VASC ACCESS RIGHT 01/17/2016 Sandi Mariscal, MD [facility]  . IR GENERIC HISTORICAL  01/17/2016   IR ANGIOGRAM PELVIS SELECTIVE OR SUPRASELECTIVE 01/17/2016 Sandi Mariscal, MD [facility]  . IR GENERIC HISTORICAL  01/17/2016   IR ANGIOGRAM PELVIS SELECTIVE OR SUPRASELECTIVE 01/17/2016 Sandi Mariscal, MD [facility]  . IR GENERIC HISTORICAL  01/17/2016   IR EMBO TUMOR ORGAN ISCHEMIA INFARCT INC GUIDE ROADMAPPING 01/17/2016 Sandi Mariscal, MD [facility]  . IR GENERIC HISTORICAL  01/17/2016   IR ANGIOGRAM SELECTIVE EACH ADDITIONAL VESSEL 01/17/2016 Sandi Mariscal, MD [facility]  . WISDOM TOOTH EXTRACTION      Allergies: Review of patient's allergies indicates no known allergies.  Medications: Prior to Admission medications   Medication Sig Start Date End Date Taking? Authorizing Provider  ibuprofen (ADVIL,MOTRIN) 600 MG tablet Take 1 tablet (600 mg total) by mouth every 6 (six) hours as needed. Patient not taking: Reported on 02/15/2016 12/23/15   Mora Bellman, MD  megestrol (MEGACE) 40 MG tablet Take 1 tablet (40 mg total) by mouth 3 (three) times daily. Patient not taking: Reported on 02/15/2016 12/23/15   Mora Bellman, MD     Family History  Problem Relation Age of Onset  . Diabetes Mother   . Diabetes Father     Social History   Social History  . Marital status: Married    Spouse name: N/A  . Number of children: N/A  . Years of education: N/A   Social History Main Topics  . Smoking status: Never Smoker  . Smokeless tobacco:  Never Used  . Alcohol use No  . Drug use: No  . Sexual activity: Not on file   Other Topics Concern  . Not on file   Social History Narrative  . No narrative on file    ECOG Status: 1 - Symptomatic but completely ambulatory  Review of Systems: A 12 point ROS discussed and pertinent positives are indicated in the HPI above.  All other systems are negative.  Review of Systems  Constitutional: Negative for activity change,  fatigue and fever.  Genitourinary: Positive for frequency, menstrual problem, pelvic pain and vaginal bleeding. Negative for flank pain.    Vital Signs: BP (!) 133/96 (BP Location: Left Arm, Patient Position: Sitting, Cuff Size: Normal)   Pulse 69   Temp 98.6 F (37 C) (Oral)   Resp 14   LMP  (LMP Unknown)   SpO2 100%   Physical Exam  Constitutional: She appears well-developed and well-nourished.  Cardiovascular: Intact distal pulses.   Abdominal: There is tenderness.  Patient is mildly tender with palpation of the lower abdomen/pelvis.  Psychiatric: She has a normal mood and affect.   Imaging:  Selected images from preprocedural pelvic MRI performed 10/12/2015 as well as intra-procedural images from bilateral uterine artery embolization performed 01/17/2016 were reviewed in detail with the patient and the patient's husband.  Ir Angiogram Pelvis Selective Or Supraselective  Result Date: 01/17/2016 INDICATION: Symptomatic uterine fibroids. Please refer to prior consultations in EP[facility] EMR for additional details EXAM: Uterine Fibroid Embolization MEDICATIONS: Toradol 30 mg IV; Ancef 2 gm IV. The antibiotic was administered within one hour of the procedure ANESTHESIA/SEDATION: Moderate (conscious) sedation was employed during this procedure. A total of Versed 3.5 mg, Dilaudid 1 mg IV and Fentanyl 175 mcg was administered intravenously. Moderate Sedation Time: 65 minutes. The patient's level of consciousness and vital signs were monitored continuously by radiology nursing throughout the procedure under my direct supervision. CONTRAST:  135 cc of Isovue-300 FLUOROSCOPY TIME:  20 minutes 18 seconds (3,299 mGy) COMPLICATIONS: None immediate. PROCEDURE: Informed consent was obtained from the patient following explanation of the procedure, risks, benefits and alternatives. The patient understands, agrees and consents for the procedure. All questions were addressed. A time out was performed prior to the  initiation of the procedure. Maximal barrier sterile technique utilized including caps, mask, sterile gowns, sterile gloves, large sterile drape, hand hygiene, and Betadine prep. The right femoral head was marked fluoroscopically. Under sterile conditions and local anesthesia, the right common femoral artery access was performed with a micropuncture needle. Under direct ultrasound guidance, the right common femoral was accessed with a micropuncture kit. An ultrasound image was saved for documentation purposes. This allowed for placement of a 5-French vascular sheath. A limited arteriogram was performed through the side arm of the sheath confirming appropriate access within the right common femoral artery. The 5-French C2 catheter was utilized to select the contralateral left internal iliac artery. Selective left internal iliac angiogram was performed. The tortuous left uterine artery was identified. Selective catheterization was performed of the left uterine artery with a microcatheter and micro guide wire. A selective left uterine angiogram was performed. This demonstrated patency of the left uterine artery. Mild diffuse hypervascularity of the enlarged fibroid uterus. Access was adequate for embolization. For embolization, 1 and 2/3 vials of 500 - 700 micron Embospheres were injected into the left uterine artery. Post embolization angiogram confirms complete stasis of the left uterine vascular territory. Microcatheter was removed. The C2 catheter was retracted and utilized to select  the right internal iliac artery. Selective right internal iliac angiogram was performed. The patent right uterine artery was identified. For selective catheterization, the micro catheter and guidewire were utilized to select the right uterine artery. Selective right uterine angiogram was performed. This demonstrated patency of the right uterine artery. Catheter position was safe for embolization. Embolization was performed to complete  stasis with injection of 2/3 of 500-700 micron Embospheres. Post embolization angiogram confirms complete stasis of the right uterine vascular territory. At this point, all wires, catheters and sheaths were removed from the patient. Hemostasis was achieved at the right groin access site with manual compression. A dressing was placed. The patient tolerated the procedure well without immediate post procedural complication. FINDINGS: Bilateral internal iliac arteriograms demonstrates conventional branching pattern with widely patent origins of the bilateral uterine arteries. Sub selective bilateral uterine arteriograms demonstrate co-dominant supply to a hypertrophied myomatous uterus. Completion arteriograms following bilateral uterine artery particle embolization demonstrates a technically excellent result with stasis of flow within the bilateral uterine vascular territories. IMPRESSION: Successful bilateral uterine artery embolization (U F E). PLAN: The patient will be seen in follow-up consultation at the interventional radiology clinic in approximately 3-4 weeks. Electronically Signed   By: Sandi Mariscal M.D.   On: 01/17/2016 13:06   Ir Angiogram Pelvis Selective Or Supraselective  Result Date: 01/17/2016 INDICATION: Symptomatic uterine fibroids. Please refer to prior consultations in EPIC EMR for additional details EXAM: Uterine Fibroid Embolization MEDICATIONS: Toradol 30 mg IV; Ancef 2 gm IV. The antibiotic was administered within one hour of the procedure ANESTHESIA/SEDATION: Moderate (conscious) sedation was employed during this procedure. A total of Versed 3.5 mg, Dilaudid 1 mg IV and Fentanyl 175 mcg was administered intravenously. Moderate Sedation Time: 65 minutes. The patient's level of consciousness and vital signs were monitored continuously by radiology nursing throughout the procedure under my direct supervision. CONTRAST:  135 cc of Isovue-300 FLUOROSCOPY TIME:  20 minutes 18 seconds (4,818 mGy)  COMPLICATIONS: None immediate. PROCEDURE: Informed consent was obtained from the patient following explanation of the procedure, risks, benefits and alternatives. The patient understands, agrees and consents for the procedure. All questions were addressed. A time out was performed prior to the initiation of the procedure. Maximal barrier sterile technique utilized including caps, mask, sterile gowns, sterile gloves, large sterile drape, hand hygiene, and Betadine prep. The right femoral head was marked fluoroscopically. Under sterile conditions and local anesthesia, the right common femoral artery access was performed with a micropuncture needle. Under direct ultrasound guidance, the right common femoral was accessed with a micropuncture kit. An ultrasound image was saved for documentation purposes. This allowed for placement of a 5-French vascular sheath. A limited arteriogram was performed through the side arm of the sheath confirming appropriate access within the right common femoral artery. The 5-French C2 catheter was utilized to select the contralateral left internal iliac artery. Selective left internal iliac angiogram was performed. The tortuous left uterine artery was identified. Selective catheterization was performed of the left uterine artery with a microcatheter and micro guide wire. A selective left uterine angiogram was performed. This demonstrated patency of the left uterine artery. Mild diffuse hypervascularity of the enlarged fibroid uterus. Access was adequate for embolization. For embolization, 1 and 2/3 vials of 500 - 700 micron Embospheres were injected into the left uterine artery. Post embolization angiogram confirms complete stasis of the left uterine vascular territory. Microcatheter was removed. The C2 catheter was retracted and utilized to select the right internal iliac artery. Selective right internal  iliac angiogram was performed. The patent right uterine artery was identified. For  selective catheterization, the micro catheter and guidewire were utilized to select the right uterine artery. Selective right uterine angiogram was performed. This demonstrated patency of the right uterine artery. Catheter position was safe for embolization. Embolization was performed to complete stasis with injection of 2/3 of 500-700 micron Embospheres. Post embolization angiogram confirms complete stasis of the right uterine vascular territory. At this point, all wires, catheters and sheaths were removed from the patient. Hemostasis was achieved at the right groin access site with manual compression. A dressing was placed. The patient tolerated the procedure well without immediate post procedural complication. FINDINGS: Bilateral internal iliac arteriograms demonstrates conventional branching pattern with widely patent origins of the bilateral uterine arteries. Sub selective bilateral uterine arteriograms demonstrate co-dominant supply to a hypertrophied myomatous uterus. Completion arteriograms following bilateral uterine artery particle embolization demonstrates a technically excellent result with stasis of flow within the bilateral uterine vascular territories. IMPRESSION: Successful bilateral uterine artery embolization (U F E). PLAN: The patient will be seen in follow-up consultation at the interventional radiology clinic in approximately 3-4 weeks. Electronically Signed   By: Sandi Mariscal M.D.   On: 01/17/2016 13:06   Ir Angiogram Selective Each Additional Vessel  Result Date: 01/17/2016 INDICATION: Symptomatic uterine fibroids. Please refer to prior consultations in EPIC EMR for additional details EXAM: Uterine Fibroid Embolization MEDICATIONS: Toradol 30 mg IV; Ancef 2 gm IV. The antibiotic was administered within one hour of the procedure ANESTHESIA/SEDATION: Moderate (conscious) sedation was employed during this procedure. A total of Versed 3.5 mg, Dilaudid 1 mg IV and Fentanyl 175 mcg was  administered intravenously. Moderate Sedation Time: 65 minutes. The patient's level of consciousness and vital signs were monitored continuously by radiology nursing throughout the procedure under my direct supervision. CONTRAST:  135 cc of Isovue-300 FLUOROSCOPY TIME:  20 minutes 18 seconds (2,952 mGy) COMPLICATIONS: None immediate. PROCEDURE: Informed consent was obtained from the patient following explanation of the procedure, risks, benefits and alternatives. The patient understands, agrees and consents for the procedure. All questions were addressed. A time out was performed prior to the initiation of the procedure. Maximal barrier sterile technique utilized including caps, mask, sterile gowns, sterile gloves, large sterile drape, hand hygiene, and Betadine prep. The right femoral head was marked fluoroscopically. Under sterile conditions and local anesthesia, the right common femoral artery access was performed with a micropuncture needle. Under direct ultrasound guidance, the right common femoral was accessed with a micropuncture kit. An ultrasound image was saved for documentation purposes. This allowed for placement of a 5-French vascular sheath. A limited arteriogram was performed through the side arm of the sheath confirming appropriate access within the right common femoral artery. The 5-French C2 catheter was utilized to select the contralateral left internal iliac artery. Selective left internal iliac angiogram was performed. The tortuous left uterine artery was identified. Selective catheterization was performed of the left uterine artery with a microcatheter and micro guide wire. A selective left uterine angiogram was performed. This demonstrated patency of the left uterine artery. Mild diffuse hypervascularity of the enlarged fibroid uterus. Access was adequate for embolization. For embolization, 1 and 2/3 vials of 500 - 700 micron Embospheres were injected into the left uterine artery. Post  embolization angiogram confirms complete stasis of the left uterine vascular territory. Microcatheter was removed. The C2 catheter was retracted and utilized to select the right internal iliac artery. Selective right internal iliac angiogram was performed. The patent right  uterine artery was identified. For selective catheterization, the micro catheter and guidewire were utilized to select the right uterine artery. Selective right uterine angiogram was performed. This demonstrated patency of the right uterine artery. Catheter position was safe for embolization. Embolization was performed to complete stasis with injection of 2/3 of 500-700 micron Embospheres. Post embolization angiogram confirms complete stasis of the right uterine vascular territory. At this point, all wires, catheters and sheaths were removed from the patient. Hemostasis was achieved at the right groin access site with manual compression. A dressing was placed. The patient tolerated the procedure well without immediate post procedural complication. FINDINGS: Bilateral internal iliac arteriograms demonstrates conventional branching pattern with widely patent origins of the bilateral uterine arteries. Sub selective bilateral uterine arteriograms demonstrate co-dominant supply to a hypertrophied myomatous uterus. Completion arteriograms following bilateral uterine artery particle embolization demonstrates a technically excellent result with stasis of flow within the bilateral uterine vascular territories. IMPRESSION: Successful bilateral uterine artery embolization (U F E). PLAN: The patient will be seen in follow-up consultation at the interventional radiology clinic in approximately 3-4 weeks. Electronically Signed   By: Sandi Mariscal M.D.   On: 01/17/2016 13:06   Ir Angiogram Selective Each Additional Vessel  Result Date: 01/17/2016 INDICATION: Symptomatic uterine fibroids. Please refer to prior consultations in EPIC EMR for additional details  EXAM: Uterine Fibroid Embolization MEDICATIONS: Toradol 30 mg IV; Ancef 2 gm IV. The antibiotic was administered within one hour of the procedure ANESTHESIA/SEDATION: Moderate (conscious) sedation was employed during this procedure. A total of Versed 3.5 mg, Dilaudid 1 mg IV and Fentanyl 175 mcg was administered intravenously. Moderate Sedation Time: 65 minutes. The patient's level of consciousness and vital signs were monitored continuously by radiology nursing throughout the procedure under my direct supervision. CONTRAST:  135 cc of Isovue-300 FLUOROSCOPY TIME:  20 minutes 18 seconds (4,166 mGy) COMPLICATIONS: None immediate. PROCEDURE: Informed consent was obtained from the patient following explanation of the procedure, risks, benefits and alternatives. The patient understands, agrees and consents for the procedure. All questions were addressed. A time out was performed prior to the initiation of the procedure. Maximal barrier sterile technique utilized including caps, mask, sterile gowns, sterile gloves, large sterile drape, hand hygiene, and Betadine prep. The right femoral head was marked fluoroscopically. Under sterile conditions and local anesthesia, the right common femoral artery access was performed with a micropuncture needle. Under direct ultrasound guidance, the right common femoral was accessed with a micropuncture kit. An ultrasound image was saved for documentation purposes. This allowed for placement of a 5-French vascular sheath. A limited arteriogram was performed through the side arm of the sheath confirming appropriate access within the right common femoral artery. The 5-French C2 catheter was utilized to select the contralateral left internal iliac artery. Selective left internal iliac angiogram was performed. The tortuous left uterine artery was identified. Selective catheterization was performed of the left uterine artery with a microcatheter and micro guide wire. A selective left uterine  angiogram was performed. This demonstrated patency of the left uterine artery. Mild diffuse hypervascularity of the enlarged fibroid uterus. Access was adequate for embolization. For embolization, 1 and 2/3 vials of 500 - 700 micron Embospheres were injected into the left uterine artery. Post embolization angiogram confirms complete stasis of the left uterine vascular territory. Microcatheter was removed. The C2 catheter was retracted and utilized to select the right internal iliac artery. Selective right internal iliac angiogram was performed. The patent right uterine artery was identified. For selective catheterization, the  micro catheter and guidewire were utilized to select the right uterine artery. Selective right uterine angiogram was performed. This demonstrated patency of the right uterine artery. Catheter position was safe for embolization. Embolization was performed to complete stasis with injection of 2/3 of 500-700 micron Embospheres. Post embolization angiogram confirms complete stasis of the right uterine vascular territory. At this point, all wires, catheters and sheaths were removed from the patient. Hemostasis was achieved at the right groin access site with manual compression. A dressing was placed. The patient tolerated the procedure well without immediate post procedural complication. FINDINGS: Bilateral internal iliac arteriograms demonstrates conventional branching pattern with widely patent origins of the bilateral uterine arteries. Sub selective bilateral uterine arteriograms demonstrate co-dominant supply to a hypertrophied myomatous uterus. Completion arteriograms following bilateral uterine artery particle embolization demonstrates a technically excellent result with stasis of flow within the bilateral uterine vascular territories. IMPRESSION: Successful bilateral uterine artery embolization (U F E). PLAN: The patient will be seen in follow-up consultation at the interventional radiology  clinic in approximately 3-4 weeks. Electronically Signed   By: Sandi Mariscal M.D.   On: 01/17/2016 13:06   Ir US Guide Vasc Access Right  Result Date: 01/17/2016 INDICATION: Symptomatic uterine fibroids. Please refer to prior consultations in EPIC EMR for additional details EXAM: Uterine Fibroid Embolization MEDICATIONS: Toradol 30 mg IV; Ancef 2 gm IV. The antibiotic was administered within one hour of the procedure ANESTHESIA/SEDATION: Moderate (conscious) sedation was employed during this procedure. A total of Versed 3.5 mg, Dilaudid 1 mg IV and Fentanyl 175 mcg was administered intravenously. Moderate Sedation Time: 65 minutes. The patient's level of consciousness and vital signs were monitored continuously by radiology nursing throughout the procedure under my direct supervision. CONTRAST:  135 cc of Isovue-300 FLUOROSCOPY TIME:  20 minutes 18 seconds (1,607 mGy) COMPLICATIONS: None immediate. PROCEDURE: Informed consent was obtained from the patient following explanation of the procedure, risks, benefits and alternatives. The patient understands, agrees and consents for the procedure. All questions were addressed. A time out was performed prior to the initiation of the procedure. Maximal barrier sterile technique utilized including caps, mask, sterile gowns, sterile gloves, large sterile drape, hand hygiene, and Betadine prep. The right femoral head was marked fluoroscopically. Under sterile conditions and local anesthesia, the right common femoral artery access was performed with a micropuncture needle. Under direct ultrasound guidance, the right common femoral was accessed with a micropuncture kit. An ultrasound image was saved for documentation purposes. This allowed for placement of a 5-French vascular sheath. A limited arteriogram was performed through the side arm of the sheath confirming appropriate access within the right common femoral artery. The 5-French C2 catheter was utilized to select the  contralateral left internal iliac artery. Selective left internal iliac angiogram was performed. The tortuous left uterine artery was identified. Selective catheterization was performed of the left uterine artery with a microcatheter and micro guide wire. A selective left uterine angiogram was performed. This demonstrated patency of the left uterine artery. Mild diffuse hypervascularity of the enlarged fibroid uterus. Access was adequate for embolization. For embolization, 1 and 2/3 vials of 500 - 700 micron Embospheres were injected into the left uterine artery. Post embolization angiogram confirms complete stasis of the left uterine vascular territory. Microcatheter was removed. The C2 catheter was retracted and utilized to select the right internal iliac artery. Selective right internal iliac angiogram was performed. The patent right uterine artery was identified. For selective catheterization, the micro catheter and guidewire were utilized to select  the right uterine artery. Selective right uterine angiogram was performed. This demonstrated patency of the right uterine artery. Catheter position was safe for embolization. Embolization was performed to complete stasis with injection of 2/3 of 500-700 micron Embospheres. Post embolization angiogram confirms complete stasis of the right uterine vascular territory. At this point, all wires, catheters and sheaths were removed from the patient. Hemostasis was achieved at the right groin access site with manual compression. A dressing was placed. The patient tolerated the procedure well without immediate post procedural complication. FINDINGS: Bilateral internal iliac arteriograms demonstrates conventional branching pattern with widely patent origins of the bilateral uterine arteries. Sub selective bilateral uterine arteriograms demonstrate co-dominant supply to a hypertrophied myomatous uterus. Completion arteriograms following bilateral uterine artery particle  embolization demonstrates a technically excellent result with stasis of flow within the bilateral uterine vascular territories. IMPRESSION: Successful bilateral uterine artery embolization (U F E). PLAN: The patient will be seen in follow-up consultation at the interventional radiology clinic in approximately 3-4 weeks. Electronically Signed   By: Sandi Mariscal M.D.   On: 01/17/2016 13:06   Ir Embo Tumor Organ Ischemia Infarct Inc Guide Roadmapping  Result Date: 01/17/2016 INDICATION: Symptomatic uterine fibroids. Please refer to prior consultations in EPIC EMR for additional details EXAM: Uterine Fibroid Embolization MEDICATIONS: Toradol 30 mg IV; Ancef 2 gm IV. The antibiotic was administered within one hour of the procedure ANESTHESIA/SEDATION: Moderate (conscious) sedation was employed during this procedure. A total of Versed 3.5 mg, Dilaudid 1 mg IV and Fentanyl 175 mcg was administered intravenously. Moderate Sedation Time: 65 minutes. The patient's level of consciousness and vital signs were monitored continuously by radiology nursing throughout the procedure under my direct supervision. CONTRAST:  135 cc of Isovue-300 FLUOROSCOPY TIME:  20 minutes 18 seconds (0,093 mGy) COMPLICATIONS: None immediate. PROCEDURE: Informed consent was obtained from the patient following explanation of the procedure, risks, benefits and alternatives. The patient understands, agrees and consents for the procedure. All questions were addressed. A time out was performed prior to the initiation of the procedure. Maximal barrier sterile technique utilized including caps, mask, sterile gowns, sterile gloves, large sterile drape, hand hygiene, and Betadine prep. The right femoral head was marked fluoroscopically. Under sterile conditions and local anesthesia, the right common femoral artery access was performed with a micropuncture needle. Under direct ultrasound guidance, the right common femoral was accessed with a micropuncture  kit. An ultrasound image was saved for documentation purposes. This allowed for placement of a 5-French vascular sheath. A limited arteriogram was performed through the side arm of the sheath confirming appropriate access within the right common femoral artery. The 5-French C2 catheter was utilized to select the contralateral left internal iliac artery. Selective left internal iliac angiogram was performed. The tortuous left uterine artery was identified. Selective catheterization was performed of the left uterine artery with a microcatheter and micro guide wire. A selective left uterine angiogram was performed. This demonstrated patency of the left uterine artery. Mild diffuse hypervascularity of the enlarged fibroid uterus. Access was adequate for embolization. For embolization, 1 and 2/3 vials of 500 - 700 micron Embospheres were injected into the left uterine artery. Post embolization angiogram confirms complete stasis of the left uterine vascular territory. Microcatheter was removed. The C2 catheter was retracted and utilized to select the right internal iliac artery. Selective right internal iliac angiogram was performed. The patent right uterine artery was identified. For selective catheterization, the micro catheter and guidewire were utilized to select the right uterine artery. Selective  right uterine angiogram was performed. This demonstrated patency of the right uterine artery. Catheter position was safe for embolization. Embolization was performed to complete stasis with injection of 2/3 of 500-700 micron Embospheres. Post embolization angiogram confirms complete stasis of the right uterine vascular territory. At this point, all wires, catheters and sheaths were removed from the patient. Hemostasis was achieved at the right groin access site with manual compression. A dressing was placed. The patient tolerated the procedure well without immediate post procedural complication. FINDINGS: Bilateral internal  iliac arteriograms demonstrates conventional branching pattern with widely patent origins of the bilateral uterine arteries. Sub selective bilateral uterine arteriograms demonstrate co-dominant supply to a hypertrophied myomatous uterus. Completion arteriograms following bilateral uterine artery particle embolization demonstrates a technically excellent result with stasis of flow within the bilateral uterine vascular territories. IMPRESSION: Successful bilateral uterine artery embolization (U F E). PLAN: The patient will be seen in follow-up consultation at the interventional radiology clinic in approximately 3-4 weeks. Electronically Signed   By: Sandi Mariscal M.D.   On: 01/17/2016 13:06    Labs:  CBC:  Recent Labs  08/20/15 2225 12/14/15 1148 01/17/16 0820  WBC 10.7* 8.6 8.8  HGB 11.2* 11.2* 10.8*  HCT 35.4* 35.6* 34.6*  PLT 364 420* 454*    COAGS:  Recent Labs  01/17/16 0820  INR 1.04    BMP:  Recent Labs  08/20/15 2225 10/04/15 1444 01/17/16 0820  NA 137 138 138  K 3.9 4.9 3.6  CL 104 101 110  CO2 24 24 22   GLUCOSE 92 81 89  BUN 8 9 11   CALCIUM 9.5 9.2 8.9  CREATININE 0.74 0.69 0.71  GFRNONAA >60  --  >60  GFRAA >60  --  >60    LIVER FUNCTION TESTS:  Recent Labs  10/04/15 1444  BILITOT 0.3  AST 26  ALT 25  ALKPHOS 49  PROT 6.8  ALBUMIN 4.0    Assessment and Plan:  Felicia Roman is a 40 y.o. (G4, P2) female with no significant past medical history who returns at interventional radiology clinic for postprocedural evaluation following technically successful bilateral uterine artery embolization performed 01/17/2016.   The patient has recovered completely from the procedure with no immediate or delayed post procedural complications, however continues to complain of near daily vaginal spotting/bleeding and intermittent pelvic pain which is worse at the time of her intermittent heavy spotting.  Selected images from preprocedural pelvic MRI performed  10/12/2015 as well as intra-procedural images from bilateral uterine artery embolization performed 01/17/2016 were reviewed in detail with the patient and the patient's husband.  I explained the expected course of fibroid involution following UFE typically requires sometimes up to 6 months to notice the full benefits of the procedure, which is particularly relevant in this patient with such a large dominant fibroid (the fibroid was noted to be approximately 11 cm on contrast enhanced pelvic MRI performed 10/12/2015).  I explained to the patient that if she were to experience persistent symptoms at the time of her 6 months follow-up post procedural evaluation (tentatively scheduled for February 2018), a follow-up contrast enhanced pelvic MRI would be obtained at that time. If (hopefully) the patient's symptoms are resolved, further imaging would not be necessary.  The patient and the patient's husband demonstrated fair understanding of the above discussion.  They have been encouraged to call the interventional radiology clinic with any future questions or concerns.  A copy of this report was sent to the requesting provider on this date.  Electronically Signed: Sandi Mariscal 02/15/2016, 8:50 AM   I spent a total of 15 Minutes in face to face in clinical consultation, greater than 50% of which was counseling/coordinating care for post uterine fibroid embolization  .

## 2016-05-25 ENCOUNTER — Encounter: Payer: Self-pay | Admitting: Interventional Radiology

## 2016-06-22 ENCOUNTER — Telehealth: Payer: Self-pay | Admitting: *Deleted

## 2016-06-22 NOTE — Telephone Encounter (Signed)
lmom to schedule 6 mth f/u [place] with Dr. Johnney Killian

## 2016-06-27 ENCOUNTER — Other Ambulatory Visit (HOSPITAL_COMMUNITY): Payer: Self-pay | Admitting: Interventional Radiology

## 2016-06-27 DIAGNOSIS — D251 Intramural leiomyoma of uterus: Secondary | ICD-10-CM

## 2016-06-29 ENCOUNTER — Ambulatory Visit
Admission: RE | Admit: 2016-06-29 | Discharge: 2016-06-29 | Disposition: A | Discharge status: Home / Self Care | Payer: Self-pay | Source: Ambulatory Visit | Attending: Interventional Radiology | Admitting: Interventional Radiology

## 2016-06-29 ENCOUNTER — Encounter: Payer: Self-pay | Admitting: Radiology

## 2016-06-29 DIAGNOSIS — D251 Intramural leiomyoma of uterus: Secondary | ICD-10-CM

## 2016-06-29 HISTORY — PX: IR GENERIC HISTORICAL: IMG1180011

## 2016-06-29 NOTE — Progress Notes (Signed)
Patient ID: Felicia Roman, female   DOB: 12/18/1975, 41 y.o.   MRN: [id number]        Chief Complaint: Six-month follow-up post uterine fibroid embolization.  Referring Physician(s): Constant  History of Present Illness: Felicia Roman is a 41 y.o. (g4, p2) female with no siginficant past medical history who returns to the interventional radiology clinic for 6 month evaluation following bilateral uterine fibroid embolization performed 01-09-2016. She is accompanied by her daughter who serves as her Optometrist.  She states that while her cycles are now slightly longer than prior to the procedure, currently lasting approximately 7 days, previously, approximately 4 days, her cycles are associated with significant improvement in her preprocedural menorrhagia.  Patient states that her bulk symptoms are also improved with subjective improvement in her preprocedural constipation and urinary frequency. Patient does admit to intermittent pelvic pain though states this is transient and resolves without dedicated intervention.  She is overall pleased with the procedure.  Past Medical History:  Diagnosis Date  . Cyst of soft tissue    both arms, head, left leg  . Dizziness   . Elbow pain   . GERD (gastroesophageal reflux disease)   . Headache    migraines    Past Surgical History:  Procedure Laterality Date  . HYSTEROSCOPY W/D&C N/A 12/23/2015   Procedure: HYSTEROSCOPY, INTRAOPERATIVE ULTRASOUND;  Surgeon: Mora Bellman, MD;  Location: [facility];  Service: Gynecology;  Laterality: N/A;  . IR GENERIC HISTORICAL  01/17/2016   IR ANGIOGRAM SELECTIVE EACH ADDITIONAL VESSEL 01/17/2016 Sandi Mariscal, MD [facility]  . IR GENERIC HISTORICAL  01/17/2016   IR US GUIDE VASC ACCESS RIGHT 01/17/2016 Sandi Mariscal, MD [facility]  . IR GENERIC HISTORICAL  01/17/2016   IR ANGIOGRAM PELVIS SELECTIVE OR SUPRASELECTIVE 01/17/2016 Sandi Mariscal, MD [facility]  . IR GENERIC HISTORICAL  01/17/2016   IR ANGIOGRAM PELVIS  SELECTIVE OR SUPRASELECTIVE 01/17/2016 Sandi Mariscal, MD [facility]  . IR GENERIC HISTORICAL  01/17/2016   IR EMBO TUMOR ORGAN ISCHEMIA INFARCT INC GUIDE ROADMAPPING 01/17/2016 Sandi Mariscal, MD [facility]  . IR GENERIC HISTORICAL  01/17/2016   IR ANGIOGRAM SELECTIVE EACH ADDITIONAL VESSEL 01/17/2016 Sandi Mariscal, MD [facility]  . IR GENERIC HISTORICAL  02/15/2016   IR RADIOLOGIST EVAL & MGMT 02/15/2016 Sandi Mariscal, MD [facility]  . IR GENERIC HISTORICAL  06/29/2016   IR RADIOLOGIST EVAL & MGMT 06/29/2016 [facility]  . WISDOM TOOTH EXTRACTION      Allergies: Patient has no known allergies.  Medications: Prior to Admission medications   Medication Sig Start Date End Date Taking? Authorizing Provider  ibuprofen (ADVIL,MOTRIN) 600 MG tablet Take 1 tablet (600 mg total) by mouth every 6 (six) hours as needed. Patient not taking: Reported on 02/15/2016 12/23/15   Mora Bellman, MD  megestrol (MEGACE) 40 MG tablet Take 1 tablet (40 mg total) by mouth 3 (three) times daily. Patient not taking: Reported on 02/15/2016 12/23/15   Mora Bellman, MD     Family History  Problem Relation Age of Onset  . Diabetes Mother   . Diabetes Father     Social History   Social History  . Marital status: Married    Spouse name: N/A  . Number of children: N/A  . Years of education: N/A   Social History Main Topics  . Smoking status: Never Smoker  . Smokeless tobacco: Never Used  . Alcohol use No  . Drug use: No  . Sexual activity: Not on file  Other Topics Concern  . Not on file   Social History Narrative  . No narrative on file    ECOG Status: 0 - Asymptomatic  Review of Systems: A 12 point ROS discussed and pertinent positives are indicated in the HPI above.  All other systems are negative.  Review of Systems  Constitutional: Negative for activity change and appetite change.  Genitourinary: Positive for pelvic pain. Negative for urgency.  Musculoskeletal: Negative for back pain.     Vital Signs: BP 127/84 (BP Location: Left Arm, Patient Position: Sitting, Cuff Size: Normal)   Pulse 76   Temp 98.1 F (36.7 C) (Oral)   Resp 15   LMP 06/05/2016   SpO2 100%   Physical Exam  Constitutional: She appears well-developed and well-nourished.  HENT:  Head: Normocephalic and atraumatic.  Nursing note reviewed.   Imaging: Ir Radiologist Eval & Mgmt  Result Date: 06/29/2016 Please refer to "Notes" to see consult details.   Labs:  CBC:  Recent Labs  08/20/15 2225 12/14/15 1148 01/17/16 0820  WBC 10.7* 8.6 8.8  HGB 11.2* 11.2* 10.8*  HCT 35.4* 35.6* 34.6*  PLT 364 420* 454*    COAGS:  Recent Labs  01/17/16 0820  INR 1.04    BMP:  Recent Labs  08/20/15 2225 10/04/15 1444 01/17/16 0820  NA 137 138 138  K 3.9 4.9 3.6  CL 104 101 110  CO2 24 24 22   GLUCOSE 92 81 89  BUN 8 9 11   CALCIUM 9.5 9.2 8.9  CREATININE 0.74 0.69 0.71  GFRNONAA >60  --  >60  GFRAA >60  --  >60    LIVER FUNCTION TESTS:  Recent Labs  10/04/15 1444  BILITOT 0.3  AST 26  ALT 25  ALKPHOS 49  PROT 6.8  ALBUMIN 4.0    Assessment and Plan:  Felicia Roman is a 41 y.o. (g4, p2) female with no siginficant past medical history who returns to the interventional radiology clinic for 6 month evaluation following bilateral uterine fibroid embolization performed 01-09-2016. She is accompanied by her daughter who serves as her Optometrist.  The patient is overall pleased with the procedure reported marked improvement in her menorrhagia and preprocedural bulk symptoms including decreased constipation and urinary frequency.  I explained to the patient that given her age mother that there is a possibility she could regrow symptomatic prior fibroids prior to the onset of menopause though I am hopeful the embolization will give her a durable result until that point.  The patient was encouraged to call the interventional radiology clinic with any future questions or concerns  but may otherwise follow up on a when necessary basis.  A copy of this report was sent to the requesting provider on this date.  Electronically Signed: Sandi Mariscal 06/29/2016, 4:07 PM   I spent a total of 15 Minutes in face to face in clinical consultation, greater than 50% of which was counseling/coordinating care for evaluation and management post uterine fibroid embolization

## 2016-08-07 ENCOUNTER — Other Ambulatory Visit (HOSPITAL_COMMUNITY): Payer: Self-pay | Admitting: Interventional Radiology

## 2016-08-07 DIAGNOSIS — D250 Submucous leiomyoma of uterus: Secondary | ICD-10-CM

## 2016-08-15 ENCOUNTER — Ambulatory Visit (HOSPITAL_COMMUNITY)
Admission: RE | Admit: 2016-08-15 | Discharge: 2016-08-15 | Disposition: A | Discharge status: Home / Self Care | Payer: BLUE CROSS/BLUE SHIELD | Source: Ambulatory Visit | Attending: Interventional Radiology | Admitting: Interventional Radiology

## 2016-08-15 DIAGNOSIS — D252 Subserosal leiomyoma of uterus: Secondary | ICD-10-CM | POA: Diagnosis not present

## 2016-08-15 DIAGNOSIS — D250 Submucous leiomyoma of uterus: Secondary | ICD-10-CM | POA: Diagnosis present

## 2016-08-15 DIAGNOSIS — N800 Endometriosis of uterus: Secondary | ICD-10-CM | POA: Insufficient documentation

## 2016-08-15 MED ORDER — GADOBENATE DIMEGLUMINE 529 MG/ML IV SOLN
15.0000 mL | Freq: Once | INTRAVENOUS | Status: AC | PRN
  Administered 2016-08-15: 15 mL via INTRAVENOUS

## 2016-08-29 ENCOUNTER — Ambulatory Visit
Admission: RE | Admit: 2016-08-29 | Discharge: 2016-08-29 | Disposition: A | Discharge status: Home / Self Care | Payer: BLUE CROSS/BLUE SHIELD | Source: Ambulatory Visit | Attending: Interventional Radiology | Admitting: Interventional Radiology

## 2016-08-29 DIAGNOSIS — D250 Submucous leiomyoma of uterus: Secondary | ICD-10-CM

## 2016-08-29 HISTORY — PX: IR RADIOLOGIST EVAL & MGMT: IMG5224

## 2016-08-29 NOTE — Progress Notes (Signed)
Patient ID: Felicia Roman, female   DOB: [birth year], 41 y.o.   MRN: [id number]        Chief Complaint: Persistent fibroid related symptoms following uterine fibroid embolization.  Referring Physician(s): Constant  History of Present Illness: Felicia Roman is a 41 y.o. (g4, p2) female with no significant past medical history who returns to the interventional radiology clinic with complaint of persistent fibroid related symptoms following technically successful uterine fibroid embolization performed on 01/17/2016. She is again accompanied by her husband who serves as her Forensic scientist.   The patient states that she has noted significant and durable improvement in her preprocedural menorrhagia.  While transiently improved, approximately 3 months following the procedure, the patient experienced a recurrence of her pelvic pain and cramping. Patient states these symptoms are worse at the time of her cycle. Patient reports she has also experienced persistent constipation symptoms.  Given the patient's persistent symptoms, the patient underwent a post procedural contrast enhanced pelvic MRI on 08/15/2016.  Patient is otherwise without complaint. No fever or chills.  Past Medical History:  Diagnosis Date  . Cyst of soft tissue    both arms, head, left leg  . Dizziness   . Elbow pain   . GERD (gastroesophageal reflux disease)   . Headache    migraines    Past Surgical History:  Procedure Laterality Date  . HYSTEROSCOPY W/D&C N/A 12/23/2015   Procedure: HYSTEROSCOPY, INTRAOPERATIVE ULTRASOUND;  Surgeon: Mora Bellman, MD;  Location: [facility];  Service: Gynecology;  Laterality: N/A;  . IR GENERIC HISTORICAL  01/17/2016   IR ANGIOGRAM SELECTIVE EACH ADDITIONAL VESSEL 01/17/2016 Sandi Mariscal, MD [facility]  . IR GENERIC HISTORICAL  01/17/2016   IR Felicia GUIDE VASC ACCESS RIGHT 01/17/2016 Sandi Mariscal, MD [facility]  . IR GENERIC HISTORICAL  01/17/2016   IR ANGIOGRAM PELVIS SELECTIVE OR  SUPRASELECTIVE 01/17/2016 Sandi Mariscal, MD [facility]  . IR GENERIC HISTORICAL  01/17/2016   IR ANGIOGRAM PELVIS SELECTIVE OR SUPRASELECTIVE 01/17/2016 Sandi Mariscal, MD [facility]  . IR GENERIC HISTORICAL  01/17/2016   IR EMBO TUMOR ORGAN ISCHEMIA INFARCT INC GUIDE ROADMAPPING 01/17/2016 Sandi Mariscal, MD [facility]  . IR GENERIC HISTORICAL  01/17/2016   IR ANGIOGRAM SELECTIVE EACH ADDITIONAL VESSEL 01/17/2016 Sandi Mariscal, MD [facility]  . IR GENERIC HISTORICAL  02/15/2016   IR RADIOLOGIST EVAL & MGMT 02/15/2016 Sandi Mariscal, MD [facility]  . IR GENERIC HISTORICAL  06/29/2016   IR RADIOLOGIST EVAL & MGMT 06/29/2016 [facility]  . WISDOM TOOTH EXTRACTION      Allergies: Patient has no known allergies.  Medications: Prior to Admission medications   Medication Sig Start Date End Date Taking? Authorizing Provider  ibuprofen (ADVIL,MOTRIN) 600 MG tablet Take 1 tablet (600 mg total) by mouth every 6 (six) hours as needed. 12/23/15  Yes Peggy Constant, MD  megestrol (MEGACE) 40 MG tablet Take 1 tablet (40 mg total) by mouth 3 (three) times daily. Patient not taking: Reported on 02/15/2016 12/23/15   Mora Bellman, MD     Family History  Problem Relation Age of Onset  . Diabetes Mother   . Diabetes Father     Social History   Social History  . Marital status: Married    Spouse name: N/A  . Number of children: N/A  . Years of education: N/A   Social History Main Topics  . Smoking status: Never Smoker  . Smokeless tobacco: Never Used  . Alcohol use No  . Drug  use: No  . Sexual activity: Not on file   Other Topics Concern  . Not on file   Social History Narrative  . No narrative on file    ECOG Status: 1 - Symptomatic but completely ambulatory  Review of Systems: A 12 point ROS discussed and pertinent positives are indicated in the HPI above.  All other systems are negative.  Review of Systems  Vital Signs: BP (!) 140/99 (BP Location: Left Arm, Patient Position:  Sitting, Cuff Size: Normal)   Pulse 69   Temp 98.2 F (36.8 C) (Oral)   Resp 14   Ht 5\' 4"  (1.626 m)   Wt 155 lb (70.3 kg)   LMP 08/27/2016 (Approximate)   SpO2 100%   BMI 26.61 kg/m   Physical Exam   Imaging:  Selected images from patient's procedural pelvic MRI performed 10/12/2015 and post procedural pelvic MRI performed 08/15/2016 were reviewed in detail with the patient and the patient's husband.  Mr Pelvis W Wo Contrast  Result Date: 08/15/2016 CLINICAL DATA:  Uterine fibroids. Six months status post uterine artery embolization. EXAM: MRI PELVIS WITHOUT AND WITH CONTRAST TECHNIQUE: Multiplanar multisequence MR imaging of the pelvis was performed both before and after administration of intravenous contrast. CONTRAST:  28mL MULTIHANCE GADOBENATE DIMEGLUMINE 529 MG/ML IV SOLN COMPARISON:  10/12/2015 FINDINGS: Urinary Tract: No bladder abnormality identified. Bowel:  Unremarkable visualized pelvic bowel loops. Vascular/Lymphatic: No pathologically enlarged lymph nodes or other significant abnormality. Reproductive: Uterus: Measures 10.7 x 9.6 x 11.4 cm (volume = 610 cm^3). No significant change compared to previous study. Subserosal fibroid in the posterior lower uterine segment currently measures 8.9 x 8.3 x 11.3 cm, also not significant changed in size compared to previous study. This fibroid now shows near complete lack of contrast enhancement, with some contrast enhancement seen peripherally. No other fibroids identified. Thickening of myometrial junctional zone with tiny cystic foci noted in the uterine fundus, consistent with mild adenomyosis. Multiple cervical nabothian cysts again seen. Right ovary:  Appears normal.  No mass identified. Left ovary: 3 cm simple left ovarian follicular cyst noted. No mass identified. Other:  None. Musculoskeletal:  Unremarkable. IMPRESSION: 11 cm subserosal fibroid in posterior lower uterine segment shows no significant change in size, but now shows near  complete lack of contrast enhancement consistent with devascularization from recent embolization. Uterine adenomyosis noted. Electronically Signed   By: Earle Gell M.D.   On: 08/15/2016 10:54    Labs:  CBC:  Recent Labs  12/14/15 1148 01/17/16 0820  WBC 8.6 8.8  HGB 11.2* 10.8*  HCT 35.6* 34.6*  PLT 420* 454*    COAGS:  Recent Labs  01/17/16 0820  INR 1.04    BMP:  Recent Labs  10/04/15 1444 01/17/16 0820  NA 138 138  K 4.9 3.6  CL 101 110  CO2 24 22  GLUCOSE 81 89  BUN 9 11  CALCIUM 9.2 8.9  CREATININE 0.69 0.71  GFRNONAA  --  >60  GFRAA  --  >60    LIVER FUNCTION TESTS:  Recent Labs  10/04/15 1444  BILITOT 0.3  AST 26  ALT 25  ALKPHOS 49  PROT 6.8  ALBUMIN 4.0    Assessment and Plan:  Felicia Roman is a 41 y.o. (g4, p2) female with no significant past medical history who returns to the interventional radiology clinic with complaint of persistent fibroid related symptoms following technically successful uterine fibroid embolization performed on 01/17/2016.   The patient states that she has noted  significant and durable improvement in her preprocedural menorrhagia.  While transiently improved, approximately 3 months following the procedure, the patient experienced a recurrence of her pelvic pain and cramping. Patient states these symptoms are worse at the time of her cycle.   Procedural pelvic MRI demonstrates a technically excellent result without any residual enhancement of the dominant fibroid arising exophytically from the posterior aspect of the lower segment of the uterus. Unfortunately, there has been little to no change in the size of the fibroid, currently measuring 11.4 x 8.9 x 8.3 cm, previously, 11.4 x 9.0 x 8.7 cm.  Selected images from patient's procedural pelvic MRI performed 10/12/2015 and post procedural pelvic MRI performed 08/15/2016 were reviewed in detail with the patient and the patient's husband.  I explained as there has been  little change in the size of the fibroid for the past 7 months, I am doubtful she will experience significant fibroid involution to result in a clinically significant improvement in her post residual bulk symptoms.  Additionally, given lack of persistent enhancement of the dominant fibroid, repeat embolization is of no utility.   As such, I explained that given patient's persistent symptoms, surgical options (hysterectomy versus myomectomy) could be considered as deemed appropriate by Dr. Elly Modena.  The patient and the patient's husband demonstrated a fair understanding of this conversation.  A copy of this report was sent to the requesting provider on this date.  Electronically Signed: Sandi Mariscal 08/29/2016, 8:30 AM   I spent a total of 15 Minutes in face to face in clinical consultation, greater than 50% of which was counseling/coordinating care for Symptomatic uterine fibroids.

## 2016-10-11 ENCOUNTER — Ambulatory Visit: Payer: BLUE CROSS/BLUE SHIELD | Admitting: Obstetrics and Gynecology

## 2016-10-24 ENCOUNTER — Other Ambulatory Visit: Payer: Self-pay | Admitting: Obstetrics & Gynecology

## 2016-11-01 NOTE — Patient Instructions (Addendum)
Your procedure is scheduled on:  Monday, June 18  Enter through the Main Entrance of [facility] at: 11:30 am  Pick up the phone at the desk and dial [phone].  Call this number if you have problems the morning of surgery: [phone].  Remember: Do NOT eat food after midnight Sunday  Do NOT drink clear liquids after 7 am Monday, day of surgery  Take these medicines the morning of surgery with a SIP OF WATER:  None  Do not smoke on day of surgery.  Stop all herbal medications and vitamin supplements at this time.  Do NOT wear jewelry (body piercing), metal hair clips/bobby pins, make-up, or nail polish. Do NOT wear lotions, powders, or perfumes.  You may wear deoderant. Do NOT shave for 48 hours prior to surgery. Do NOT bring valuables to the hospital.   Leave suitcase in car.  After surgery it may be brought to your room.  For patients admitted to the hospital, checkout time is 11:00 AM the day of discharge. Have a responsible adult drive you home and stay with you for 24 hours after your procedure.  Home with husband Mitulkumar Sedlacek cell [phone]

## 2016-11-03 ENCOUNTER — Encounter (HOSPITAL_COMMUNITY)
Admission: RE | Admit: 2016-11-03 | Discharge: 2016-11-03 | Disposition: A | Discharge status: Home / Self Care | Payer: BLUE CROSS/BLUE SHIELD | Source: Ambulatory Visit | Attending: Obstetrics & Gynecology | Admitting: Obstetrics & Gynecology

## 2016-11-03 ENCOUNTER — Other Ambulatory Visit: Payer: Self-pay | Admitting: Urology

## 2016-11-03 ENCOUNTER — Encounter: Payer: Self-pay | Admitting: Interventional Radiology

## 2016-11-03 HISTORY — DX: Anemia, unspecified: D64.9

## 2016-11-03 LAB — CBC
HEMATOCRIT: 38.3 % (ref 36.0–46.0)
HEMOGLOBIN: 12.2 g/dL (ref 12.0–15.0)
MCH: 24.6 pg — ABNORMAL LOW (ref 26.0–34.0)
MCHC: 31.9 g/dL (ref 30.0–36.0)
MCV: 77.2 fL — AB (ref 78.0–100.0)
Platelets: 323 10*3/uL (ref 150–400)
RBC: 4.96 MIL/uL (ref 3.87–5.11)
RDW: 15.6 % — ABNORMAL HIGH (ref 11.5–15.5)
WBC: 6.1 10*3/uL (ref 4.0–10.5)

## 2016-11-03 LAB — BASIC METABOLIC PANEL
ANION GAP: 9 (ref 5–15)
BUN: 12 mg/dL (ref 6–20)
CHLORIDE: 105 mmol/L (ref 101–111)
CO2: 23 mmol/L (ref 22–32)
Calcium: 9.5 mg/dL (ref 8.9–10.3)
Creatinine, Ser: 0.66 mg/dL (ref 0.44–1.00)
GFR calc Af Amer: >60 mL/min (ref >60)
Glucose, Bld: 100 mg/dL — ABNORMAL HIGH (ref 65–99)
POTASSIUM: 3.8 mmol/L (ref 3.5–5.1)
SODIUM: 137 mmol/L (ref 135–145)

## 2016-11-03 LAB — TYPE AND SCREEN
ABO/RH(D): B POS
ANTIBODY SCREEN: NEGATIVE

## 2016-11-03 LAB — ABO/RH: ABO/RH(D): B POS

## 2016-11-05 NOTE — H&P (Signed)
Felicia Roman is an 41 y.o. 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 S/p UFE in 2017 failed to reduce fibroid size and symptoms. She does not have abnormal uterine bleeding and D&C in 2017 was negative. MRI, CT, sono reviewed.  SVDx2, S/p TL. Monogamous (one partner) and normal Pap hx.   No LMP recorded.    Past Medical History:  Diagnosis Date  . Anemia   . Cyst of soft tissue    both arms, head, left leg  . Dizziness    hx - no longer a problem  . Elbow pain    hx - right - no longer a problem  . GERD (gastroesophageal reflux disease)    occasional - diet controlled - no meds  . SVD (spontaneous vaginal delivery)    x 2    Past Surgical History:  Procedure Laterality Date  . BREAST SURGERY Right    cysts benign  . HYSTEROSCOPY W/D&C N/A 12/23/2015   Procedure: HYSTEROSCOPY, INTRAOPERATIVE ULTRASOUND;  Surgeon: Felicia Bellman, MD;  Location: [facility];  Service: Gynecology;  Laterality: N/A;  . IR GENERIC HISTORICAL  01/17/2016   IR ANGIOGRAM SELECTIVE EACH ADDITIONAL VESSEL 01/17/2016 Felicia Mariscal, MD [facility]  . IR GENERIC HISTORICAL  01/17/2016   IR US GUIDE VASC ACCESS RIGHT 01/17/2016 Felicia Mariscal, MD [facility]  . IR GENERIC HISTORICAL  01/17/2016   IR ANGIOGRAM PELVIS SELECTIVE OR SUPRASELECTIVE 01/17/2016 Felicia Mariscal, MD [facility]  . IR GENERIC HISTORICAL  01/17/2016   IR ANGIOGRAM PELVIS SELECTIVE OR SUPRASELECTIVE 01/17/2016 Felicia Mariscal, MD [facility]  . IR GENERIC HISTORICAL  01/17/2016   IR EMBO TUMOR ORGAN ISCHEMIA INFARCT INC GUIDE ROADMAPPING 01/17/2016 Felicia Mariscal, MD [facility]  . IR GENERIC HISTORICAL  01/17/2016   IR ANGIOGRAM SELECTIVE EACH ADDITIONAL VESSEL 01/17/2016 Felicia Mariscal, MD [facility]  . IR GENERIC HISTORICAL  02/15/2016   IR RADIOLOGIST EVAL & MGMT 02/15/2016 Felicia Mariscal, MD [facility]  . IR GENERIC HISTORICAL  06/29/2016   IR RADIOLOGIST EVAL & MGMT 06/29/2016 [facility]  . IR RADIOLOGIST EVAL & MGMT  08/29/2016  . WISDOM TOOTH EXTRACTION      Family History  Problem Relation Age of Onset  . Diabetes Mother   . Diabetes Father     Social History:  reports that she has never smoked. She has never used smokeless tobacco. She reports that she does not drink alcohol or use drugs.  Allergies: No Known Allergies  No prescriptions prior to admission.    ROS above  There were no vitals taken for this visit. Physical Exam Physical exam:  A&O x 3, no acute distress. Pleasant HEENT neg, no thyromegaly Lungs CTA bilat CV RRR, S1S2 normal Abdo soft, non tender, non acute Extr no edema/ tenderness Pelvic Enlarged uterus fills up pelvis.    CBC    Component Value Date/Time   WBC 6.1 11/03/2016 0905   RBC 4.96 11/03/2016 0905   HGB 12.2 11/03/2016 0905   HCT 38.3 11/03/2016 0905   PLT 323 11/03/2016 0905   MCV 77.2 (L) 11/03/2016 0905   MCH 24.6 (L) 11/03/2016 0905   MCHC 31.9 11/03/2016 0905   RDW 15.6 (H) 11/03/2016 0905   LYMPHSABS 3.0 01/17/2016 0820   MONOABS 0.7 01/17/2016 0820   EOSABS 0.3 01/17/2016 0820   BASOSABS 0.0 01/17/2016 0820   CMP Latest Ref Rng & Units 11/03/2016 01/17/2016 10/04/2015  Glucose 65 -  99 mg/dL 100(H) 89 81  BUN 6 - 20 mg/dL 12 11 9   Creatinine 0.44 - 1.00 mg/dL 0.66 0.71 0.69  Sodium 135 - 145 mmol/L 137 138 138  Potassium 3.5 - 5.1 mmol/L 3.8 3.6 4.9  Chloride 101 - 111 mmol/L 105 110 101  CO2 22 - 32 mmol/L 23 22 24   Calcium 8.9 - 10.3 mg/dL 9.5 8.9 9.2  Total Protein 6.1 - 8.1 g/dL - - 6.8  Total Bilirubin 0.2 - 1.2 mg/dL - - 0.3  Alkaline Phos 33 - 115 U/L - - 49  AST 10 - 30 U/L - - 26  ALT 6 - 29 U/L - - 25     Assessment/Plan: Large subserosal posterior lower segment possible cervical fibroid with wide base. Planning pre-op stents by Urology. Here for total abdominal hysterectomy and bilateral salpingectomy. Possible myomectomy only since may decrease getting close to ureters and  uterine vessels since uterus is otherwise normal in size. Marland Kitchen   Risks/complications of surgery reviewed incl infection, bleeding, damage to internal organs including bladder, bowels, ureters, blood vessels, other risks from anesthesia, VTE and delayed complications of any surgery, complications in future surgery reviewed.   Felicia Roman R 11/05/2016, 6:46 PM

## 2016-11-06 ENCOUNTER — Inpatient Hospital Stay (HOSPITAL_COMMUNITY): Payer: BLUE CROSS/BLUE SHIELD

## 2016-11-06 ENCOUNTER — Inpatient Hospital Stay (HOSPITAL_COMMUNITY): Payer: BLUE CROSS/BLUE SHIELD | Admitting: Anesthesiology

## 2016-11-06 ENCOUNTER — Inpatient Hospital Stay (HOSPITAL_COMMUNITY)
Admission: RE | Admit: 2016-11-06 | Discharge: 2016-11-08 | DRG: 743 | Disposition: A | Discharge status: Home / Self Care | Payer: BLUE CROSS/BLUE SHIELD | Source: Ambulatory Visit | Attending: Obstetrics & Gynecology | Admitting: Obstetrics & Gynecology

## 2016-11-06 ENCOUNTER — Encounter (HOSPITAL_COMMUNITY)
Admission: RE | Disposition: A | Discharge status: Home / Self Care | Payer: Self-pay | Source: Ambulatory Visit | Attending: Obstetrics & Gynecology

## 2016-11-06 ENCOUNTER — Encounter (HOSPITAL_COMMUNITY): Payer: Self-pay

## 2016-11-06 DIAGNOSIS — D259 Leiomyoma of uterus, unspecified: Principal | ICD-10-CM | POA: Diagnosis present

## 2016-11-06 DIAGNOSIS — R350 Frequency of micturition: Secondary | ICD-10-CM | POA: Diagnosis present

## 2016-11-06 DIAGNOSIS — N941 Unspecified dyspareunia: Secondary | ICD-10-CM | POA: Diagnosis present

## 2016-11-06 DIAGNOSIS — Z720 Tobacco use: Secondary | ICD-10-CM

## 2016-11-06 DIAGNOSIS — Z90710 Acquired absence of both cervix and uterus: Secondary | ICD-10-CM | POA: Diagnosis present

## 2016-11-06 DIAGNOSIS — D260 Other benign neoplasm of cervix uteri: Secondary | ICD-10-CM | POA: Diagnosis present

## 2016-11-06 HISTORY — PX: HYSTERECTOMY ABDOMINAL WITH SALPINGECTOMY: SHX6725

## 2016-11-06 HISTORY — PX: CYSTOSCOPY W/ URETERAL STENT PLACEMENT: SHX1429

## 2016-11-06 LAB — PREGNANCY, URINE: PREG TEST UR: NEGATIVE

## 2016-11-06 SURGERY — HYSTERECTOMY, TOTAL, ABDOMINAL, WITH SALPINGECTOMY
Anesthesia: General | Site: Abdomen

## 2016-11-06 MED ORDER — DIATRIZOATE MEGLUMINE 30 % UR SOLN: URETHRAL | Status: DC | PRN

## 2016-11-06 MED ORDER — ONDANSETRON HCL 4 MG/2ML IJ SOLN: 4.0000 mg | Freq: Four times a day (QID) | INTRAMUSCULAR | Status: DC | PRN

## 2016-11-06 MED ORDER — GLYCOPYRROLATE 0.2 MG/ML IJ SOLN
INTRAMUSCULAR | Status: DC | PRN
  Administered 2016-11-06: 0.4 mg via INTRAVENOUS
  Administered 2016-11-06: 0.1 mg via INTRAVENOUS

## 2016-11-06 MED ORDER — DEXAMETHASONE SODIUM PHOSPHATE 10 MG/ML IJ SOLN
INTRAMUSCULAR | Status: AC
  Filled 2016-11-06: qty 1

## 2016-11-06 MED ORDER — NEOSTIGMINE METHYLSULFATE 10 MG/10ML IV SOLN
INTRAVENOUS | Status: AC
  Filled 2016-11-06: qty 1

## 2016-11-06 MED ORDER — STERILE WATER FOR IRRIGATION IR SOLN
Status: DC | PRN
  Administered 2016-11-06: 1000 mL

## 2016-11-06 MED ORDER — HYDROMORPHONE 1 MG/ML IV SOLN
INTRAVENOUS | Status: DC
  Administered 2016-11-06: 19:00:00 via INTRAVENOUS
  Administered 2016-11-06: 0.8 mg via INTRAVENOUS
  Administered 2016-11-07: 2.3 mg via INTRAVENOUS
  Administered 2016-11-07: 1 mg via INTRAVENOUS
  Administered 2016-11-07: 2.3 mg via INTRAVENOUS
  Administered 2016-11-07: 0.8 mg via INTRAVENOUS
  Filled 2016-11-06: qty 25

## 2016-11-06 MED ORDER — ONDANSETRON HCL 4 MG PO TABS: 4.0000 mg | ORAL_TABLET | Freq: Four times a day (QID) | ORAL | Status: DC | PRN

## 2016-11-06 MED ORDER — EPHEDRINE SULFATE 50 MG/ML IJ SOLN
INTRAMUSCULAR | Status: DC | PRN
  Administered 2016-11-06 (×3): 5 mg via INTRAVENOUS

## 2016-11-06 MED ORDER — HYDROMORPHONE HCL 1 MG/ML IJ SOLN
INTRAMUSCULAR | Status: AC
  Filled 2016-11-06: qty 0.5

## 2016-11-06 MED ORDER — FERROUS SULFATE 325 (65 FE) MG PO TABS
325.0000 mg | ORAL_TABLET | Freq: Every day | ORAL | Status: DC
  Administered 2016-11-07 – 2016-11-08 (×2): 325 mg via ORAL
  Filled 2016-11-06 (×2): qty 1

## 2016-11-06 MED ORDER — GLYCOPYRROLATE 0.2 MG/ML IJ SOLN
INTRAMUSCULAR | Status: AC
  Filled 2016-11-06: qty 2

## 2016-11-06 MED ORDER — BUPIVACAINE HCL (PF) 0.25 % IJ SOLN
INTRAMUSCULAR | Status: DC | PRN
  Administered 2016-11-06: 25 mL

## 2016-11-06 MED ORDER — MENTHOL 3 MG MT LOZG: 1.0000 | LOZENGE | OROMUCOSAL | Status: DC | PRN

## 2016-11-06 MED ORDER — CEFAZOLIN SODIUM-DEXTROSE 2-4 GM/100ML-% IV SOLN
2.0000 g | INTRAVENOUS | Status: AC
  Administered 2016-11-06: 2 g via INTRAVENOUS

## 2016-11-06 MED ORDER — BUPIVACAINE HCL (PF) 0.25 % IJ SOLN
INTRAMUSCULAR | Status: AC
  Filled 2016-11-06: qty 30

## 2016-11-06 MED ORDER — IOPAMIDOL (ISOVUE-300) INJECTION 61%
30.0000 mL | Freq: Once | INTRAVENOUS | Status: AC | PRN
  Administered 2016-11-06: 11 mL via URETHRAL

## 2016-11-06 MED ORDER — SODIUM CHLORIDE 0.9 % IV SOLN
INTRAVENOUS | Status: DC
  Administered 2016-11-06 – 2016-11-07 (×2): via INTRAVENOUS

## 2016-11-06 MED ORDER — HYDROMORPHONE HCL 1 MG/ML IJ SOLN
0.2500 mg | INTRAMUSCULAR | Status: DC | PRN
  Administered 2016-11-06: 0.5 mg via INTRAVENOUS

## 2016-11-06 MED ORDER — VASOPRESSIN 20 UNIT/ML IV SOLN
INTRAVENOUS | Status: AC
  Filled 2016-11-06: qty 1

## 2016-11-06 MED ORDER — GLYCOPYRROLATE 0.2 MG/ML IJ SOLN
INTRAMUSCULAR | Status: AC
  Filled 2016-11-06: qty 1

## 2016-11-06 MED ORDER — PANTOPRAZOLE SODIUM 40 MG PO TBEC
40.0000 mg | DELAYED_RELEASE_TABLET | Freq: Every day | ORAL | Status: DC
  Administered 2016-11-07 – 2016-11-08 (×2): 40 mg via ORAL
  Filled 2016-11-06 (×2): qty 1

## 2016-11-06 MED ORDER — ROCURONIUM BROMIDE 100 MG/10ML IV SOLN
INTRAVENOUS | Status: DC | PRN
  Administered 2016-11-06: 5 mg via INTRAVENOUS
  Administered 2016-11-06: 40 mg via INTRAVENOUS
  Administered 2016-11-06: 15 mg via INTRAVENOUS

## 2016-11-06 MED ORDER — PROMETHAZINE HCL 25 MG/ML IJ SOLN: 12.5000 mg | Freq: Once | INTRAMUSCULAR | Status: DC

## 2016-11-06 MED ORDER — NALOXONE HCL 0.4 MG/ML IJ SOLN: 0.4000 mg | INTRAMUSCULAR | Status: DC | PRN

## 2016-11-06 MED ORDER — FENTANYL CITRATE (PF) 250 MCG/5ML IJ SOLN
INTRAMUSCULAR | Status: AC
  Filled 2016-11-06: qty 5

## 2016-11-06 MED ORDER — DEXAMETHASONE SODIUM PHOSPHATE 10 MG/ML IJ SOLN
INTRAMUSCULAR | Status: DC | PRN
  Administered 2016-11-06: 10 mg via INTRAVENOUS

## 2016-11-06 MED ORDER — LIDOCAINE HCL (CARDIAC) 20 MG/ML IV SOLN
INTRAVENOUS | Status: DC | PRN
  Administered 2016-11-06: 100 mg via INTRAVENOUS

## 2016-11-06 MED ORDER — MIDAZOLAM HCL 2 MG/2ML IJ SOLN
INTRAMUSCULAR | Status: AC
  Filled 2016-11-06: qty 2

## 2016-11-06 MED ORDER — NEOSTIGMINE METHYLSULFATE 10 MG/10ML IV SOLN
INTRAVENOUS | Status: DC | PRN
  Administered 2016-11-06: 2 mg via INTRAVENOUS

## 2016-11-06 MED ORDER — PROPOFOL 10 MG/ML IV BOLUS
INTRAVENOUS | Status: AC
  Filled 2016-11-06: qty 20

## 2016-11-06 MED ORDER — KETOROLAC TROMETHAMINE 30 MG/ML IJ SOLN: 30.0000 mg | Freq: Once | INTRAMUSCULAR | Status: DC | PRN

## 2016-11-06 MED ORDER — ROCURONIUM BROMIDE 100 MG/10ML IV SOLN
INTRAVENOUS | Status: AC
  Filled 2016-11-06: qty 1

## 2016-11-06 MED ORDER — FENTANYL CITRATE (PF) 100 MCG/2ML IJ SOLN
INTRAMUSCULAR | Status: DC | PRN
  Administered 2016-11-06: 100 ug via INTRAVENOUS
  Administered 2016-11-06 (×5): 50 ug via INTRAVENOUS
  Administered 2016-11-06: 100 ug via INTRAVENOUS

## 2016-11-06 MED ORDER — SCOPOLAMINE 1 MG/3DAYS TD PT72
1.0000 | MEDICATED_PATCH | Freq: Once | TRANSDERMAL | Status: DC
  Administered 2016-11-06: 1.5 mg via TRANSDERMAL

## 2016-11-06 MED ORDER — DIATRIZOATE MEGLUMINE 30 % UR SOLN
URETHRAL | Status: AC
  Filled 2016-11-06: qty 100

## 2016-11-06 MED ORDER — DIPHENHYDRAMINE HCL 12.5 MG/5ML PO ELIX: 12.5000 mg | ORAL_SOLUTION | Freq: Four times a day (QID) | ORAL | Status: DC | PRN

## 2016-11-06 MED ORDER — SCOPOLAMINE 1 MG/3DAYS TD PT72
MEDICATED_PATCH | TRANSDERMAL | Status: AC
  Filled 2016-11-06: qty 1

## 2016-11-06 MED ORDER — DIPHENHYDRAMINE HCL 50 MG/ML IJ SOLN: 12.5000 mg | Freq: Four times a day (QID) | INTRAMUSCULAR | Status: DC | PRN

## 2016-11-06 MED ORDER — LIDOCAINE HCL (CARDIAC) 20 MG/ML IV SOLN
INTRAVENOUS | Status: AC
Start: 2016-11-06 — End: 2016-11-06
  Filled 2016-11-06: qty 5

## 2016-11-06 MED ORDER — LACTATED RINGERS IV SOLN
INTRAVENOUS | Status: DC
  Administered 2016-11-06 (×3): via INTRAVENOUS
  Administered 2016-11-06: 125 mL/h via INTRAVENOUS

## 2016-11-06 MED ORDER — SODIUM CHLORIDE 0.9% FLUSH: 9.0000 mL | INTRAVENOUS | Status: DC | PRN

## 2016-11-06 MED ORDER — SODIUM CHLORIDE 0.9 % IV SOLN: 350.0000 mL | Freq: Once | INTRAVENOUS | Status: DC

## 2016-11-06 MED ORDER — PROMETHAZINE HCL 25 MG/ML IJ SOLN: 6.2500 mg | INTRAMUSCULAR | Status: DC | PRN

## 2016-11-06 MED ORDER — MEPERIDINE HCL 25 MG/ML IJ SOLN: 6.2500 mg | INTRAMUSCULAR | Status: DC | PRN

## 2016-11-06 MED ORDER — EPHEDRINE 5 MG/ML INJ
INTRAVENOUS | Status: AC
  Filled 2016-11-06: qty 10

## 2016-11-06 MED ORDER — ONDANSETRON HCL 4 MG/2ML IJ SOLN
INTRAMUSCULAR | Status: DC | PRN
  Administered 2016-11-06: 4 mg via INTRAVENOUS

## 2016-11-06 MED ORDER — ONDANSETRON HCL 4 MG/2ML IJ SOLN
INTRAMUSCULAR | Status: AC
  Filled 2016-11-06: qty 2

## 2016-11-06 MED ORDER — VASOPRESSIN 20 UNIT/ML IV SOLN
INTRAVENOUS | Status: DC | PRN
  Administered 2016-11-06: 15 mL via INTRAMUSCULAR

## 2016-11-06 MED ORDER — PROPOFOL 10 MG/ML IV BOLUS
INTRAVENOUS | Status: DC | PRN
  Administered 2016-11-06: 160 mg via INTRAVENOUS

## 2016-11-06 MED ORDER — MIDAZOLAM HCL 2 MG/2ML IJ SOLN
INTRAMUSCULAR | Status: DC | PRN
Start: 2016-11-06 — End: 2016-11-06
  Administered 2016-11-06: 2 mg via INTRAVENOUS

## 2016-11-06 SURGICAL SUPPLY — 63 items
BAG DECANTER FOR FLEXI CONT (MISCELLANEOUS) ×4 IMPLANT
BENZOIN TINCTURE PRP APPL 2/3 (GAUZE/BANDAGES/DRESSINGS) ×4 IMPLANT
CANISTER SUCT 3000ML PPV (MISCELLANEOUS) ×8 IMPLANT
CATH FOLEY 2WAY SLVR  5CC 18FR (CATHETERS) ×1
CATH FOLEY 2WAY SLVR 5CC 18FR (CATHETERS) ×3 IMPLANT
CATH ROBINSON RED A/P 16FR (CATHETERS) IMPLANT
CATH URET 5FR 28IN OPEN ENDED (CATHETERS) ×8 IMPLANT
CLOTH BEACON ORANGE TIMEOUT ST (SAFETY) ×4 IMPLANT
CONT PATH 16OZ SNAP LID 3702 (MISCELLANEOUS) ×4 IMPLANT
COVER LIGHT HANDLE  1/PK (MISCELLANEOUS) ×1
COVER LIGHT HANDLE 1/PK (MISCELLANEOUS) ×3 IMPLANT
DRAPE C-ARM 42X72 X-RAY (DRAPES) ×4 IMPLANT
DRAPE CESAREAN BIRTH W POUCH (DRAPES) ×4 IMPLANT
DRAPE WARM FLUID 44X44 (DRAPE) IMPLANT
DRSG OPSITE POSTOP 4X10 (GAUZE/BANDAGES/DRESSINGS) ×4 IMPLANT
DURAPREP 26ML APPLICATOR (WOUND CARE) ×4 IMPLANT
ELECT REM PT RETURN 9FT ADLT (ELECTROSURGICAL) ×4
ELECTRODE REM PT RTRN 9FT ADLT (ELECTROSURGICAL) ×3 IMPLANT
GAUZE SPONGE 4X4 12PLY STRL LF (GAUZE/BANDAGES/DRESSINGS) ×4 IMPLANT
GAUZE SPONGE 4X4 16PLY XRAY LF (GAUZE/BANDAGES/DRESSINGS) IMPLANT
GLOVE BIO SURGEON STRL SZ7 (GLOVE) ×8 IMPLANT
GLOVE BIOGEL PI IND STRL 6.5 (GLOVE) ×15 IMPLANT
GLOVE BIOGEL PI IND STRL 7.0 (GLOVE) ×15 IMPLANT
GLOVE BIOGEL PI INDICATOR 6.5 (GLOVE) ×5
GLOVE BIOGEL PI INDICATOR 7.0 (GLOVE) ×5
GLOVE SURG SS PI 8.0 STRL IVOR (GLOVE) ×4 IMPLANT
GOWN STRL REUS W/TWL LRG LVL3 (GOWN DISPOSABLE) ×16 IMPLANT
GUIDEWIRE STR DUAL SENSOR (WIRE) ×4 IMPLANT
HEMOSTAT ARISTA ABSORB 3G PWDR (MISCELLANEOUS) ×4 IMPLANT
NDL SAFETY ECLIPSE 18X1.5 (NEEDLE) ×6 IMPLANT
NEEDLE HYPO 18GX1.5 SHARP (NEEDLE) ×2
NEEDLE HYPO 22GX1.5 SAFETY (NEEDLE) ×4 IMPLANT
NEEDLE HYPO 25X1 1.5 SAFETY (NEEDLE) ×4 IMPLANT
NS IRRIG 1000ML POUR BTL (IV SOLUTION) ×8 IMPLANT
PACK ABDOMINAL GYN (CUSTOM PROCEDURE TRAY) ×4 IMPLANT
PACK VAGINAL WOMENS (CUSTOM PROCEDURE TRAY) IMPLANT
PAD ABD 8X7 1/2 STERILE (GAUZE/BANDAGES/DRESSINGS) ×4 IMPLANT
PAD OB MATERNITY 4.3X12.25 (PERSONAL CARE ITEMS) ×4 IMPLANT
PLUG CATH AND CAP STER (CATHETERS) IMPLANT
PROTECTOR NERVE ULNAR (MISCELLANEOUS) ×4 IMPLANT
SET CYSTO W/LG BORE CLAMP LF (SET/KITS/TRAYS/PACK) IMPLANT
SPONGE LAP 18X18 X RAY DECT (DISPOSABLE) IMPLANT
STAPLER VISISTAT 35W (STAPLE) IMPLANT
STENT CONTOUR URETERAL (STENTS) IMPLANT
STENT URET 6FRX24 CONTOUR (STENTS) IMPLANT
STRIP CLOSURE SKIN 1/2X4 (GAUZE/BANDAGES/DRESSINGS) ×4 IMPLANT
SUT PLAIN 2 0 XLH (SUTURE) IMPLANT
SUT PROLENE 0 CT 1 30 (SUTURE) IMPLANT
SUT SILK 2 0 SH (SUTURE) ×4 IMPLANT
SUT VIC AB 0 CT1 18XCR BRD8 (SUTURE) ×9 IMPLANT
SUT VIC AB 0 CT1 36 (SUTURE) ×16 IMPLANT
SUT VIC AB 0 CT1 8-18 (SUTURE) ×3
SUT VIC AB 2-0 SH 27 (SUTURE) ×1
SUT VIC AB 2-0 SH 27XBRD (SUTURE) ×3 IMPLANT
SUT VIC AB 4-0 KS 27 (SUTURE) IMPLANT
SUT VIC AB 4-0 SH 18 (SUTURE) IMPLANT
SUT VICRYL 0 TIES 12 18 (SUTURE) ×4 IMPLANT
SYR 10ML LL (SYRINGE) ×4 IMPLANT
SYR 30ML LL (SYRINGE) ×4 IMPLANT
SYR CONTROL 10ML LL (SYRINGE) ×12 IMPLANT
SYR TB 1ML 25GX5/8 (SYRINGE) ×4 IMPLANT
TOWEL OR 17X24 6PK STRL BLUE (TOWEL DISPOSABLE) ×16 IMPLANT
TRAY FOLEY CATH SILVER 14FR (SET/KITS/TRAYS/PACK) ×8 IMPLANT

## 2016-11-06 NOTE — Transfer of Care (Signed)
Immediate Anesthesia Transfer of Care Note  Patient: Felicia Roman  Procedure(s) Performed: Procedure(s): HYSTERECTOMY ABDOMINAL with Bilateral SALPINGECTOMY (Bilateral) CYSTOSCOPY WITH BILATERAL RETROGRADE PYELOGRAM/BILATERAL STENT PLACEMENT (Bilateral)  Patient Location: PACU  Anesthesia Type:General  Level of Consciousness: oriented, drowsy and patient cooperative  Airway & Oxygen Therapy: Patient connected to nasal cannula oxygen  Post-op Assessment: Report given to RN and Post -op Vital signs reviewed and stable  Post vital signs: Reviewed and stable  Last Vitals:  Vitals:   11/06/16 1211  BP: (!) 138/92  Pulse: 60  Resp: 16  Temp: 36.7 C    Last Pain:  Vitals:   11/06/16 1211  TempSrc: Oral  PainSc: 0-No pain      Patients Stated Pain Goal: 2 (10/07/31 5825)  Complications: No apparent anesthesia complications

## 2016-11-06 NOTE — H&P (Signed)
CC: I urinate too frequently in the daytime.  HPI: Felicia Roman is a 41 year-old female patient who is here for urinary frequency.  She first noticed the symptom approximately 10/20/2012. She does urinate more frequently than once every 4 hours in the daytime.   She does not usually have swelling in her hands and feet during the day. She does not take a diuretic. She does not have to strain or bear down to start her urinary stream. She does have an abnormal sensation when needing to urinate.   She is not having problems with urinary control or incontinence.     ALLERGIES: None   MEDICATIONS: None   GU PSH: No GU PSH      PSH Notes: fibroid adhesion   NON-GU PSH: No Non-GU PSH    GU PMH: None   NON-GU PMH: GERD    FAMILY HISTORY: 1 Daughter - Daughter 1 son - Son Death In The Family Mother - Mother   SOCIAL HISTORY: Marital Status: Married Current Smoking Status: Patient has never smoked.   Tobacco Use Assessment Completed: Used Tobacco in last 30 days? Has never drank.  Drinks 1 caffeinated drink per day.    REVIEW OF SYSTEMS:    GU Review Female:   Patient denies frequent urination, hard to postpone urination, burning /pain with urination, get up at night to urinate, leakage of urine, stream starts and stops, trouble starting your stream, have to strain to urinate, and being pregnant.  Gastrointestinal (Upper):   Patient denies nausea, vomiting, and indigestion/ heartburn.  Gastrointestinal (Lower):   Patient denies diarrhea and constipation.  Constitutional:   Patient denies night sweats, fever, fatigue, and weight loss.  Skin:   Patient denies skin rash/ lesion and itching.  Eyes:   Patient denies blurred vision and double vision.  Ears/ Nose/ Throat:   Patient denies sore throat and sinus problems.  Hematologic/Lymphatic:   Patient denies swollen glands and easy bruising.  Cardiovascular:   Patient denies leg swelling and chest pains.  Respiratory:   Patient denies  cough and shortness of breath.  Endocrine:   Patient denies excessive thirst.  Musculoskeletal:   Patient denies back pain and joint pain.  Neurological:   Patient denies headaches and dizziness.  Psychologic:   Patient denies depression and anxiety.   VITAL SIGNS:      11/03/2016 11:56 AM  Weight 158 lb / 71.67 kg  Height 64 in / 162.56 cm  BP 158/101 mmHg  Pulse 54 /min  Temperature 97.4 F / 36 C  BMI 27.1 kg/m   MULTI-SYSTEM PHYSICAL EXAMINATION:    Constitutional: Well-nourished. No physical deformities. Normally developed. Good grooming.  Neck: Neck symmetrical, not swollen. Normal tracheal position.  Respiratory: No labored breathing, no use of accessory muscles.   Cardiovascular: Normal temperature, normal extremity pulses, no swelling, no varicosities.  Lymphatic: No enlargement of neck, axillae, groin.  Skin: No paleness, no jaundice, no cyanosis. No lesion, no ulcer, no rash.  Neurologic / Psychiatric: Oriented to time, oriented to place, oriented to person. No depression, no anxiety, no agitation.  Gastrointestinal: No mass, no tenderness, no rigidity, non obese abdomen.  Eyes: Normal conjunctivae. Normal eyelids.  Ears, Nose, Mouth, and Throat: Left ear no scars, no lesions, no masses. Right ear no scars, no lesions, no masses. Nose no scars, no lesions, no masses. Normal hearing. Normal lips.  Musculoskeletal: Normal gait and station of head and neck.     PAST DATA REVIEWED:  Source Of History:  Patient   PROCEDURES:          Urinalysis Dipstick Dipstick Cont'd  Color: Straw Bilirubin: Neg  Appearance: Clear Ketones: Neg  Specific Gravity: 1.015 Blood: Neg  pH: 6.0 Protein: Neg  Glucose: Neg Urobilinogen: 0.2    Nitrites: Neg    Leukocyte Esterase: Neg    ASSESSMENT:      ICD-10 Details  1 GU:   Urinary Frequency - R35.0    PLAN:           Schedule Return Visit/Planned Activity: 3 Months - Office Visit          Document Letter(s):  Created for  Patient: Clinical Summary   Created for [employer]. Mody, MD         Notes:   Urinary frequnecy/urgency:  -likely related to enlarged uterus compressing the bladder. She is schedule fro surgery on 6/18 with Gynecology. I would recommend bilateral ureteral stent placement prior to surgery.  We discussed that her LUTS may not improve after surgery and if they do not then we will proceed with workup for OAB

## 2016-11-06 NOTE — Anesthesia Postprocedure Evaluation (Signed)
Anesthesia Post Note  Patient: Felicia Roman  Procedure(s) Performed: Procedure(s) (LRB): HYSTERECTOMY ABDOMINAL with Bilateral SALPINGECTOMY (Bilateral) CYSTOSCOPY WITH BILATERAL RETROGRADE PYELOGRAM/BILATERAL STENT PLACEMENT (Bilateral)     Patient location during evaluation: PACU Anesthesia Type: General Level of consciousness: awake Pain management: pain level controlled Vital Signs Assessment: post-procedure vital signs reviewed and stable Respiratory status: spontaneous breathing Cardiovascular status: stable Postop Assessment: no signs of nausea or vomiting Anesthetic complications: no    Last Vitals:  Vitals:   11/06/16 1715 11/06/16 1730  BP: 120/79   Pulse: 61   Resp: 16 16  Temp: 36.6 C     Last Pain:  Vitals:   11/06/16 1211  TempSrc: Oral  PainSc: 0-No pain   Pain Goal: Patients Stated Pain Goal: 2 (11/06/16 1715)               [place]

## 2016-11-06 NOTE — Anesthesia Preprocedure Evaluation (Signed)
Anesthesia Evaluation  Patient identified by MRN, date of birth, ID band Patient awake    Reviewed: Allergy & Precautions, H&P , NPO status , Patient's Chart, lab work & pertinent test results  Airway Mallampati: I  TM Distance: >3 FB Neck ROM: full    Dental no notable dental hx. (+) Teeth Intact   Pulmonary neg pulmonary ROS,    Pulmonary exam normal breath sounds clear to auscultation       Cardiovascular hypertension, Normal cardiovascular exam     Neuro/Psych negative neurological ROS  negative psych ROS   GI/Hepatic Neg liver ROS, GERD  Medicated and Controlled,  Endo/Other  negative endocrine ROS  Renal/GU negative Renal ROS     Musculoskeletal   Abdominal Normal abdominal exam  (+)   Peds  Hematology   Anesthesia Other Findings   Reproductive/Obstetrics negative OB ROS                             Anesthesia Physical Anesthesia Plan  ASA: II  Anesthesia Plan: General   Post-op Pain Management:    Induction: Intravenous  PONV Risk Score and Plan: 4 or greater and Ondansetron, Dexamethasone, Propofol, Midazolam and Scopolamine patch - Pre-op  Airway Management Planned: Oral ETT  Additional Equipment:   Intra-op Plan:   Post-operative Plan: Extubation in OR  Informed Consent: I have reviewed the patients History and Physical, chart, labs and discussed the procedure including the risks, benefits and alternatives for the proposed anesthesia with the patient or authorized representative who has indicated his/her understanding and acceptance.   Dental Advisory Given  Plan Discussed with: CRNA and Surgeon  Anesthesia Plan Comments:         Anesthesia Quick Evaluation

## 2016-11-06 NOTE — Op Note (Signed)
PRE-OPERATIVE DIAGNOSIS:  Uterine Fibroids    POST-OPERATIVE DIAGNOSIS:  Uterine Fibroids - retroperitoneal posterior lower segment/ cervical fibroid ( specimen weight 530 gm)  PROCEDURE:  TOTAL ABDOMINAL HYSTERECTOMY, BILATERAL SALPINGECTOMY  SURGEON: Elveria Royals, MD  ASSISTANT:  Tiana Loft, MD  ANESTHESIA:  General endotracheal  EBL: 500 cc  IVF: 2500 cc LR   Urine output: urine in foley 200 cc, slightly blood tinged   BLOOD ADMINISTERED:  none  DRAINS: Urinary Catheter (Foley). Ureteral stent placed before hysterectomy were removed along with the foley after case was complete and a new foley catheter was placed.   LOCAL MEDICATIONS USED:  MARCAINE 0.25% 25 cc skin infiltration     SPECIMEN:  Uterus, cervix with large posterior lower segment/ cervical myoma and bilateral fallopian tubes   PATHOLOGY SPECIMEN: Uterus, cervix with fibroid, bilateral fallopian tubes.   COUNTS:  YES  PATIENT DISPOSITION:  PACU - hemodynamically stable. Then admit for post-op care.   Delay start of Pharmacological VTE agent (>24hrs) due to surgical blood loss or risk of bleeding: yes  PROCEDURE:   Indication:  Symptomatic large posterior lower segment/ cervical fibroid. Prior failed uterine fibroid embolization. Risks and complications of surgery including infection, bleeding, damage to internal organs and other including but not limited to surgery related problems including pneumonia, VTE reviewed. Informed written consent was obtained.   Patient was brought to the operating room with IV running. She received 2 gm Ancef. Underwent general anesthesia without difficulty. She was given dorsal lithotomy position and prepped and draped.  Dr Jeffie Pollock placed bilateral ureteral stents and patient was turned over to [place].   A Pfannenstiel incision was made with scalpel and carried down to the underlying fascia with Bovie with excellent hemostasis.  Fascia incised and extended laterally. Fascia  grasped with Kocher's and underlying rectus muscles were dissected down. Rectus muscles were separated in midline. Posterior rectus sheath and posterior peritoneum was grasped with mosquitoes and peritoneal entry made.  Uterus was small, tubes and ovaries appeared normal. A large 10 cm posterior lower segment myoma palpated filling up the cul de sac.  Balfour retractor placed after packing bowels.  Left adnexa to bowel adhesions excised.  Uterus itself was normal. Superior aspect of fibroid was injected with 8 cc of 20 u Pitressin in 50 cc saline. Linear incision made on serosa and fibroid dissection done all around from the serosal peritoneal reflection. Further dissection noted that the fibroid was arising from below the uterine vessels- retroperitoneal posterior cervical location. So decision was made to proceed with hysterectomy. Bilateral ureteral stents were intermittently palpated to assess its course, Left ureter was very close to left lateral aspect of the serosal /peritoneal reflection that was dissected off from over the fibroid.  Bilateral round ligaments were grasped with Kelly clamps, cut and suture transfixed with 0-Vicryl. Anterior broad ligament dissected and bladder flap created to push ureters further away. Posterior broad ligament dissected carefully assessing stents in between. Cornual clamps placed and utero-ovarian ligament and tube cut, pedicles suture transfixed x2 with 0-Vicryl. Posterior dissection done to skeletonize uterine vessels and ureters course was evaluated. Then both the uterine vessels were clamped with straight Heaney clamps and cut and suture transfixed. Tortuous vessels especially on the left were desiccated carefully and suture ligated as needed. Cardinal ligaments and uterosacral ligaments were evaluated, vaginal wall palpated under the cervix, infravaginal part of the cervix was small and fibroid stayed attached to supravaginal part of the cervix making vaginal angle  clamps easy.  Bilateral vaginal angle clamps placed and cut and vagina was opened and cut circumferentially. Specimen removed and handed off for pathology.  Vaginal angles and vaginal cut was closed from one angle to the other with 0 Vicryl interlocking sutures with excellent hemostasis. Arista was sprayed in the posterior serosal peritoneal reflection and pressure applied with a lap sponge to obtain hemostasis.  Bilateral salpingectomy performed by clamping, cutting and suture ligating with 0 Vicryl. Now lap sponge was removed and cul de sac / retroperitoneal dissection noted excellent hemostasis.  All pedicles and dissected posterior area appeared dry. All packed sponges removed. Balfour retractor removed. Peritoneal edges grasped and peritoneum closed with 2-0 Vicryl. Fascia sutured with 0 Vicryl from two ends and met in midline. Subcutaneous layer was deep and closed with 2-0 Plain gut. Skin approximated with 4-0 Vicryl in subcuticular fashion.  Sterile dressing placed.  Sterile Honeycomb dressing placed.  All  Instruments/ lap/ sponges counts were correct x2. No complications.  Foley with attached stents removed easily and a new foley was placed, stents inspected, were intact. Blood tinged urine noted as expected.   Dr Benjie Karvonen was the surgeon for entire case and was scrubbed all the time. Marland Kitchen

## 2016-11-06 NOTE — Anesthesia Procedure Notes (Signed)
Procedure Name: Intubation Date/Time: 11/06/2016 1:16 PM Performed by: Hewitt Blade Pre-anesthesia Checklist: Patient identified, Emergency Drugs available, Suction available and Patient being monitored Patient Re-evaluated:Patient Re-evaluated prior to inductionOxygen Delivery Method: Circle system utilized Preoxygenation: Pre-oxygenation with 100% oxygen Intubation Type: IV induction Ventilation: Mask ventilation without difficulty Laryngoscope Size: Mac and 3 Grade View: Grade I Tube type: Oral Tube size: 7.0 mm Number of attempts: 1 Airway Equipment and Method: Stylet Placement Confirmation: ETT inserted through vocal cords under direct vision,  positive ETCO2 and breath sounds checked- equal and bilateral Secured at: 20 cm Tube secured with: Tape Dental Injury: Teeth and Oropharynx as per pre-operative assessment

## 2016-11-07 ENCOUNTER — Encounter (HOSPITAL_COMMUNITY): Payer: Self-pay | Admitting: Obstetrics & Gynecology

## 2016-11-07 LAB — BASIC METABOLIC PANEL
ANION GAP: 6 (ref 5–15)
BUN: 7 mg/dL (ref 6–20)
CHLORIDE: 107 mmol/L (ref 101–111)
CO2: 24 mmol/L (ref 22–32)
Calcium: 8.1 mg/dL — ABNORMAL LOW (ref 8.9–10.3)
Creatinine, Ser: 0.61 mg/dL (ref 0.44–1.00)
GFR calc non Af Amer: >60 mL/min (ref >60)
Glucose, Bld: 129 mg/dL — ABNORMAL HIGH (ref 65–99)
POTASSIUM: 4.2 mmol/L (ref 3.5–5.1)
SODIUM: 137 mmol/L (ref 135–145)

## 2016-11-07 LAB — CBC
HEMATOCRIT: 34.2 % — AB (ref 36.0–46.0)
Hemoglobin: 10.9 g/dL — ABNORMAL LOW (ref 12.0–15.0)
MCH: 25 pg — ABNORMAL LOW (ref 26.0–34.0)
MCHC: 31.9 g/dL (ref 30.0–36.0)
MCV: 78.4 fL (ref 78.0–100.0)
Platelets: 300 10*3/uL (ref 150–400)
RBC: 4.36 MIL/uL (ref 3.87–5.11)
RDW: 15.6 % — ABNORMAL HIGH (ref 11.5–15.5)
WBC: 13.8 10*3/uL — AB (ref 4.0–10.5)

## 2016-11-07 MED ORDER — KETOROLAC TROMETHAMINE 30 MG/ML IJ SOLN
30.0000 mg | Freq: Four times a day (QID) | INTRAMUSCULAR | Status: DC
  Administered 2016-11-07 – 2016-11-08 (×3): 30 mg via INTRAVENOUS
  Filled 2016-11-07 (×3): qty 1

## 2016-11-07 MED ORDER — OXYCODONE-ACETAMINOPHEN 5-325 MG PO TABS
1.0000 | ORAL_TABLET | Freq: Four times a day (QID) | ORAL | Status: DC | PRN
  Administered 2016-11-07 (×2): 1 via ORAL
  Administered 2016-11-08 (×2): 2 via ORAL
  Filled 2016-11-07 (×2): qty 2
  Filled 2016-11-07 (×2): qty 1

## 2016-11-07 MED ORDER — SODIUM CHLORIDE 0.9% FLUSH
3.0000 mL | Freq: Two times a day (BID) | INTRAVENOUS | Status: DC
  Administered 2016-11-07: 3 mL via INTRAVENOUS

## 2016-11-07 NOTE — Progress Notes (Signed)
1 Day Post-Op Procedure(s) (LRB): HYSTERECTOMY ABDOMINAL with Bilateral SALPINGECTOMY (Bilateral) CYSTOSCOPY WITH BILATERAL RETROGRADE PYELOGRAM/BILATERAL STENT PLACEMENT (Bilateral)  Subjective: Patient reports nausea, incisional pain, tolerating PO and no problems voiding.  Incisional pain with ambulation, so limiting ambulation  Objective: I have reviewed patient's vital signs, intake and output and medications.  Physical exam:  A&O x 3, no acute distress. Pleasant Lungs CTA bilat CV RRR, S1S2 normal Abdo soft, non tender, non acute. Active BS. No rebound. Dressing dry (pressure dressing is still on) Extr no edema/ tenderness Pelvic no bleeding    Assessment: s/p Procedure(s): HYSTERECTOMY ABDOMINAL with Bilateral SALPINGECTOMY (Bilateral) CYSTOSCOPY WITH BILATERAL RETROGRADE PYELOGRAM/BILATERAL STENT PLACEMENT (Bilateral): stable, progressing well, tolerating diet and doing better.  Plan: Advance diet Encourage ambulation Advance to PO medication Discontinue IV fluids Dc PCA  LOS: 1 day  Surgical findings reviewed w pt and her family   Kimberleigh Mehan R 11/07/2016, 3:17 PM

## 2016-11-07 NOTE — Progress Notes (Signed)
Dilaudid 18mg  wasted in sink with Lurena Nida RN

## 2016-11-07 NOTE — Op Note (Signed)
NAME:  [facility] Felicia Roman                     ACCOUNT NO.:  MEDICAL RECORD NO.:  [id number]  LOCATION:                                 FACILITY:  PHYSICIAN:  Felicia Roman, M.D.         DATE OF BIRTH:  DATE OF PROCEDURE:  11/06/2016 DATE OF DISCHARGE:                              OPERATIVE REPORT   The patient of Felicia Prader, MD.  PROCEDURES: 1. Cystoscopy with bilateral retrograde pyelograms interpretation. 2. Insertion of bilateral ureteral catheters.  PREOPERATIVE DIAGNOSIS:  A 10 cm fibroid for myomectomy versus hysterectomy.  POSTOPERATIVE DIAGNOSIS:  A 10 cm fibroid for myomectomy versus hysterectomy.  SURGEON:  Felicia Roman, M.D.  ANESTHESIA:  General.  SPECIMEN:  None.  DRAINS:  Bilateral 5-French ureteral catheters, an 18-French Foley catheter.  COMPLICATIONS:  None.  BLOOD LOSS:  None.  INDICATIONS:  Felicia Roman is a 41 year old, [place] female with large uterine fibroid, who is to undergo myomectomy versus hysterectomy, and stents were requested prior to the procedure for ureteral identification.  FINDINGS OF PROCEDURE:  She was taken to the operating room where general anesthetic was induced.  She was given Ancef.  She was placed in lithotomy position, was fitted with PAS hose.  Her perineum and genitalia were prepped with Betadine solution, and she was draped in usual sterile fashion.  Cystoscopy was performed using the 23-French scope and 30-degree lens. Examination revealed a normal urethra.  The external sphincter was intact.  The bladder wall was smooth and pale without tumor, stones, or inflammation.  The ureteral orifices were unremarkable.  The right ureteral orifice was cannulated with 5-French open-end catheter and contrast was instilled.  This revealed a normal ureter and intrarenal collecting system without filling defects or significant dislocation.  Once the retrograde pyelogram was performed, a Sensor wire was passed to the proximal  ureter and the open-end catheter was advanced over the wire to the kidney.  The wire was removed, leaving the stent in good position.  A second 5-French open-end catheter was then passed through the other port in the bridge and the left ureteral orifice was cannulated and contrast was instilled.  Left retrograde pyelogram revealed a normal ureter and intrarenal collecting system without filling defects or displacement.  Once the retrograde pyelogram was complete, the Sensor guidewire was advanced to the kidney and the open-end catheter was then advanced over the wire without difficulty to the kidney under fluoroscopic guidance.  The wire was then removed.  The cystoscope was removed.  The ureteral catheter is exiting urethra.  An 18-French Foley catheter was inserted alongside the stent and the balloon was filled with 10 mL of sterile fluid.  The ureteral catheters were secured to the Foley with doubled 2- 0 silk ties and the proximal ends of the catheters were inserted through the hub of the Foley with the aid of an 18-gauge needle.  The catheter was then placed to straight drainage.  The case was then turned over to Dr. Benjie Roman for her portion.  There were no complications.     Felicia Roman, M.D.     JJW/MEDQ  D:  11/06/2016  T:  11/06/2016  Job:  [id number]

## 2016-11-08 MED ORDER — OXYCODONE-ACETAMINOPHEN 5-325 MG PO TABS: 1.0000 | ORAL_TABLET | Freq: Four times a day (QID) | ORAL | 0 refills | Status: AC | PRN

## 2016-11-08 NOTE — Discharge Instructions (Signed)
Abdominal Hysterectomy, Care After °This sheet gives you information about how to care for yourself after your procedure. Your doctor may also give you more specific instructions. If you have problems or questions, contact your doctor. °Follow these instructions at home: °Bathing °· Do not take baths, swim, or use a hot tub until your doctor says it is okay. Ask your doctor if you can take showers. You may only be allowed to take sponge baths for bathing. °· Keep the bandage (dressing) dry until your doctor says it can be taken off. °Surgical cut ( °incision) care °· Follow instructions from your doctor about how to take care of your cut from surgery. Make sure you: °? Wash your hands with soap and water before you change your bandage (dressing). If you cannot use soap and water, use hand sanitizer. °? Change your bandage as told by your doctor. °? Leave stitches (sutures), skin glue, or skin tape (adhesive) strips in place. They may need to stay in place for 2 weeks or longer. If tape strips get loose and curl up, you may trim the loose edges. Do not remove tape strips completely unless your doctor says it is okay. °· Check your surgical cut area every day for signs of infection. Check for: °? Redness, swelling, or pain. °? Fluid or blood. °? Warmth. °? Pus or a bad smell. °Activity °· Do gentle, daily exercise as told by your doctor. You may be told to take short walks every day and go farther each time. °· Do not lift anything that is heavier than 10 lb (4.5 kg), or the limit that your doctor tells you, until he or she says that it is safe. °· Do not drive or use heavy machinery while taking prescription pain medicine. °· Do not drive for 24 hours if you were given a medicine to help you relax (sedative). °· Follow your doctor's advice about exercise, driving, and general activities. Ask your doctor what activities are safe for you. °Lifestyle °· Do not douche, use tampons, or have sex for at least 6 weeks or as  told by your doctor. °· Do not drink alcohol until your doctor says it is okay. °· Drink enough fluid to keep your pee (urine) clear or pale yellow. °· Try to have someone at home with you for the first 1-2 weeks to help. °· Do not use any products that contain nicotine or tobacco, such as cigarettes and e-cigarettes. These can slow down healing. If you need help quitting, ask your doctor. °General instructions °· Take over-the-counter and prescription medicines only as told by your doctor. °· Do not take aspirin or ibuprofen. These medicines can cause bleeding. °· To prevent or treat constipation while you are taking prescription pain medicine, your doctor may suggest that you: °? Drink enough fluid to keep your urine clear or pale yellow. °? Take over-the-counter or prescription medicines. °? Eat foods that are high in fiber, such as: °§ Fresh fruits and vegetables. °§ Whole grains. °§ Beans. °? Limit foods that are high in fat and processed sugars, such as fried and sweet foods. °· Keep all follow-up visits as told by your doctor. This is important. °Contact a doctor if: °· You have chills or fever. °· You have redness, swelling, or pain around your cut. °· You have fluid or blood coming from your cut. °· Your cut feels warm to the touch. °· You have pus or a bad smell coming from your cut. °· Your cut breaks   open. °· You feel dizzy or light-headed. °· You have pain or bleeding when you pee. °· You keep having watery poop (diarrhea). °· You keep feeling sick to your stomach (nauseous) or keep throwing up (vomiting). °· You have unusual fluid (discharge) coming from your vagina. °· You have a rash. °· You have a reaction to your medicine. °· Your pain medicine does not help. °Get help right away if: °· You have a fever and your symptoms get worse all of a sudden. °· You have very bad belly (abdominal) pain. °· You are short of breath. °· You pass out (faint). °· You have pain, swelling, or redness of your  leg. °· You bleed a lot from your vagina and notice clumps of blood (clots). °Summary °· Do not take baths, swim, or use a hot tub until your doctor says it is okay. Ask your doctor if you can take showers. You may only be allowed to take sponge baths for bathing. °· Follow your doctor's advice about exercise, driving, and general activities. Ask your doctor what activities are safe for you. °· Do not lift anything that is heavier than 10 lb (4.5 kg), or the limit that your doctor tells you, until he or she says that it is safe. °· Try to have someone at home with you for the first 1-2 weeks to help. °This information is not intended to replace advice given to you by your health care provider. Make sure you discuss any questions you have with your health care provider. °Document Released: 02/15/2008 Document Revised: 04/26/2016 Document Reviewed: 04/26/2016 °[employer]vier Interactive Patient Education © 2017 [employer] ° °

## 2016-11-08 NOTE — Discharge Summary (Signed)
Physician Discharge Summary  Patient ID: Felicia Roman MRN: [id number] DOB/AGE: 1975/12/04 41 y.o.  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 Surgery was uncomplicated. Post-op recovery as planned, pain controlled, ambulating, voiding, tolerating regular diet.   CBC Latest Ref Rng & Units 11/07/2016 11/03/2016 01/17/2016  WBC 4.0 - 10.5 K/uL 13.8(H) 6.1 8.8  Hemoglobin 12.0 - 15.0 g/dL 10.9(L) 12.2 10.8(L)  Hematocrit 36.0 - 46.0 % 34.2(L) 38.3 34.6(L)  Platelets 150 - 400 K/uL 300 323 454(H)     Discharge Exam: Blood pressure 129/82, pulse 64, temperature 97.8 F (36.6 C), temperature source Oral, resp. rate 18, height 5\' 4"  (1.626 m), weight 158 lb (71.7 kg), SpO2 100 %.  General appearance: alert and cooperative Resp: clear to auscultation bilaterally Cardio: regular rate and rhythm, S1, S2 normal, no murmur, click, rub or gallop GI: normal findings: bowel sounds normal and no guarding/ rebound Incision/Wound: dressing dry Extr no signs of DVT  Disposition: 01-Home or Self Care  Discharge Instructions    Call MD for:    Complete by:  As directed    Heavy vaginal bleeding   Call MD for:  difficulty breathing, headache or visual disturbances    Complete by:  As directed    Call MD for:  hives    Complete by:  As directed    Call MD for:  persistant dizziness or light-headedness    Complete by:  As directed    Call MD for:  persistant nausea and vomiting    Complete by:  As directed    Call MD for:  redness, tenderness, or signs of infection (pain, swelling, redness, odor or green/yellow discharge around incision site)    Complete by:  As directed    Call MD for:  severe uncontrolled pain    Complete by:  As directed    Call MD for:   temperature >100.4    Complete by:  As directed    Diet - low sodium heart healthy    Complete by:  As directed    Increase activity slowly    Complete by:  As directed    Lifting restrictions    Complete by:  As directed    10 lbs for 6 weeks   Sexual Activity Restrictions    Complete by:  As directed    No sex till 8 weeks     Allergies as of 11/08/2016   No Known Allergies     Medication List    STOP taking these medications   megestrol 40 MG tablet Commonly known as:  MEGACE     TAKE these medications   ferrous sulfate 325 (65 FE) MG tablet Take 325 mg by mouth daily with breakfast.   FOLIC ACID PO Take 1 tablet by mouth daily.   ibuprofen 600 MG tablet Commonly known as:  ADVIL,MOTRIN Take 1 tablet (600 mg total) by mouth every 6 (six) hours as needed. What changed:  Another medication with the same name was removed. Continue taking this medication, and follow the directions you see here.   omeprazole 40 MG capsule Commonly known as:  PRILOSEC Take 40 mg by mouth daily.   oxyCODONE-acetaminophen 5-325 MG tablet Commonly known as:  PERCOCET/ROXICET Take 1 tablet by mouth every 6 (six) hours as needed for severe pain.  Follow-up Information    Azucena Fallen, MD Follow up in 2 week(s).   Specialty:  Obstetrics and Gynecology Why:  post-operative check Contact information: [address] [place] [address] [phone]           Signed: Elveria Royals 11/08/2016, 8:53 AM

## 2016-11-08 NOTE — Progress Notes (Signed)
2 Days Post-Op Procedure(s) (LRB): HYSTERECTOMY ABDOMINAL with Bilateral SALPINGECTOMY (Bilateral) CYSTOSCOPY WITH BILATERAL RETROGRADE PYELOGRAM/BILATERAL STENT PLACEMENT (Bilateral)  Subjective: Patient reports some nausea and dizziness but no vomiting, SOB, CP, LE swelling or pain. Tolerating gen diet. Voiding. No flatus.   Incisional pain with ambulation.   Objective: I have reviewed patient's vital signs, intake and output and medications. BP 129/82 (BP Location: Left Arm)   Pulse 64   Temp 97.8 F (36.6 C) (Oral)   Resp 18   Ht 5\' 4"  (1.626 m)   Wt 158 lb (71.7 kg)   SpO2 100%   BMI 27.12 kg/m   Physical exam:  A&O x 3, no acute distress. Pleasant Lungs CTA bilat CV RRR, S1S2 normal Abdo soft, non tender, non acute. Active BS. No rebound. Honeycomb dry Extr no edema/ tenderness Pelvic no bleeding    Assessment: s/p Procedure(s): HYSTERECTOMY ABDOMINAL with Bilateral SALPINGECTOMY (Bilateral) CYSTOSCOPY WITH BILATERAL RETROGRADE PYELOGRAM/BILATERAL STENT PLACEMENT (Bilateral): stable, progressing well, tolerating diet and doing better.  Plan: Discharge home.  Post-op care reviewed. Post-op meds reviewed.  F/up Dr Benjie Karvonen office in 2 wks  Salome Cozby R 11/08/2016, 8:46 AM

## 2016-11-08 NOTE — Progress Notes (Signed)
Discussed with patient discharge instructions.

## 2018-10-16 IMAGING — MR MR PELVIS WO/W CM
6 of 10 series · 19 of 48 positions shown · IV contrast (multihance)
Comparison: 10/12/2015

CLINICAL DATA: Uterine fibroids. Six months status post uterine
artery embolization.

EXAM:
MRI PELVIS WITHOUT AND WITH CONTRAST
TECHNIQUE: Multiplanar multisequence MR imaging of the pelvis was performed
both before and after administration of intravenous contrast.
CONTRAST:  15mL MULTIHANCE GADOBENATE DIMEGLUMINE 529 MG/ML IV SOLN

[Series 3: T2 · coronal · 5.0mm · 0.47mm/px · 3 of 33 slices shown (1 of 3)]
[im 1/33]
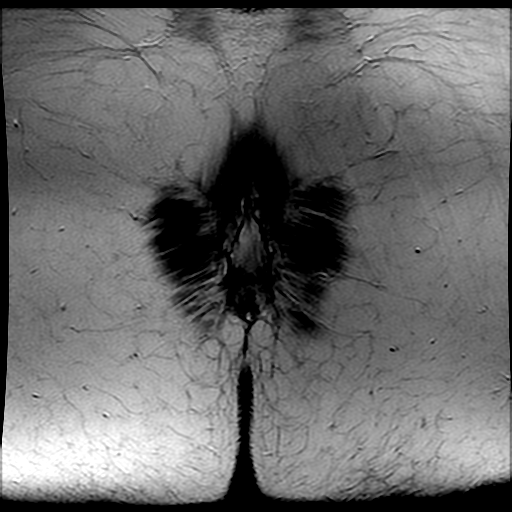
[im 17/33]
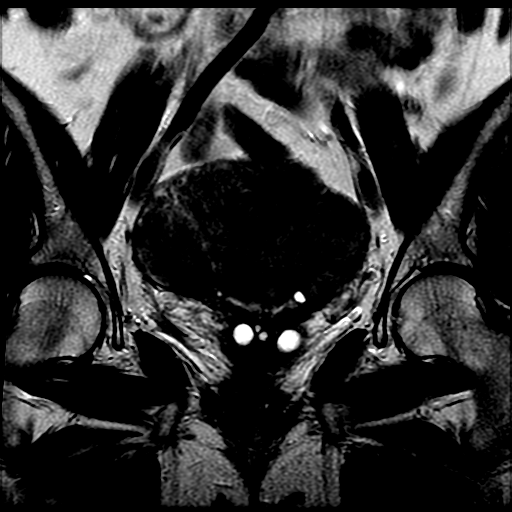
[im 33/33]
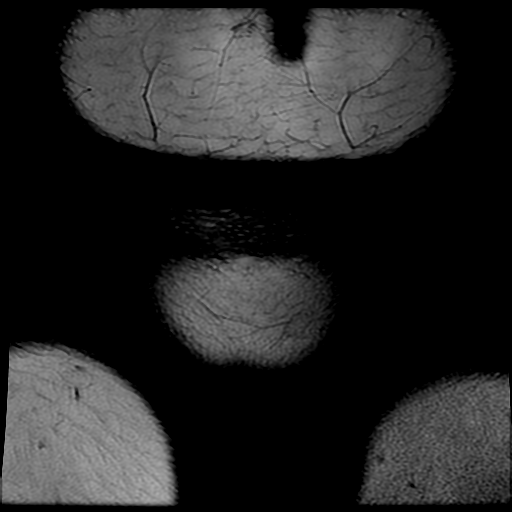

[Series 4: T2 · axial · 5.0mm · 0.47mm/px · z∈[-109,+107]mm · 4 of 37 slices shown (2 of 3)]
[im 1/37]
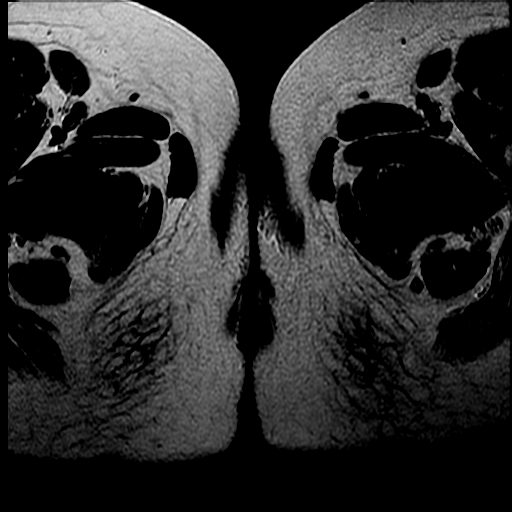
[im 13/37]
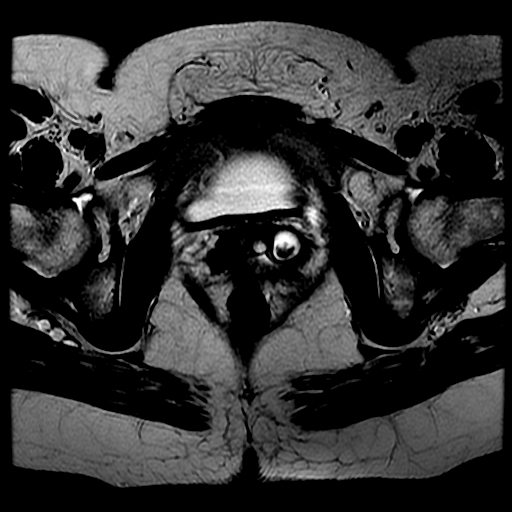
[im 25/37]
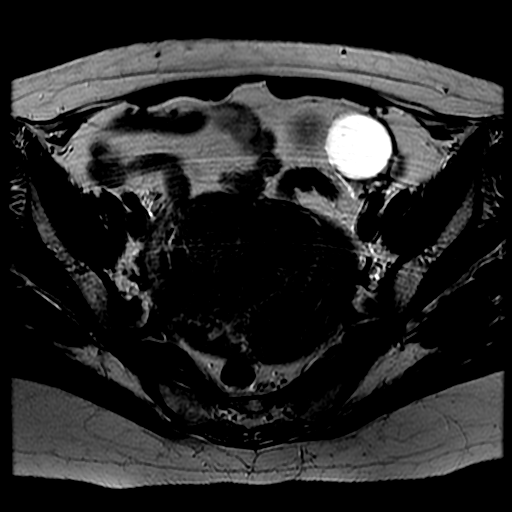
[im 37/37]
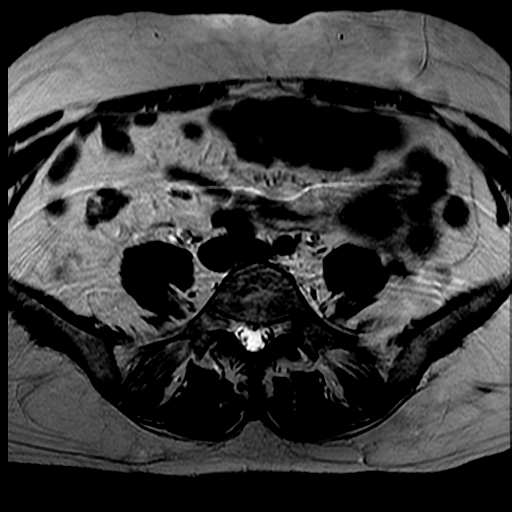

[Series 5: T2 fat-sat · axial · 5.0mm · 0.47mm/px · z∈[-109,+107]mm · 4 of 37 slices shown]
[im 1/37]
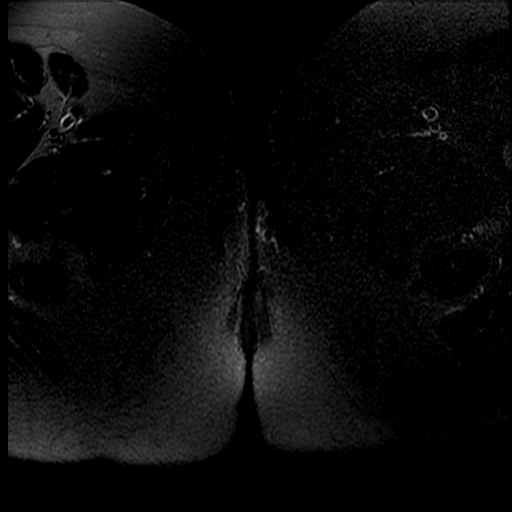
[im 13/37]
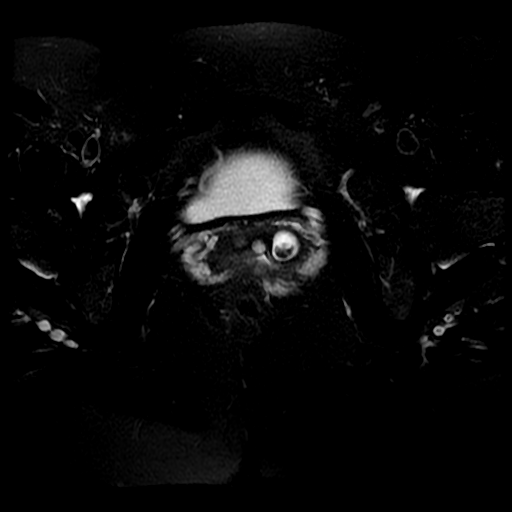
[im 25/37]
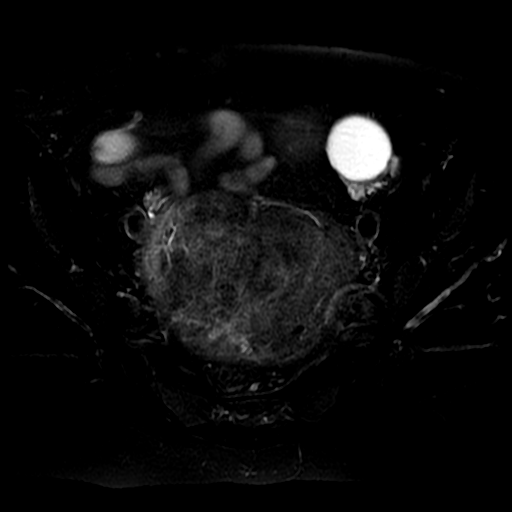
[im 37/37]
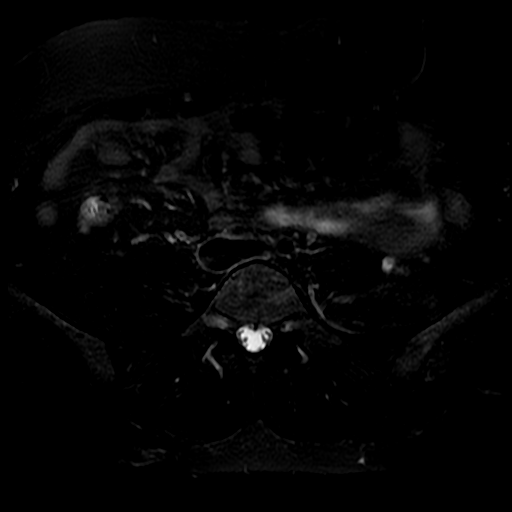

[Series 6: T2 · sagittal · 5.0mm · 0.47mm/px · 3 of 30 slices shown (3 of 3)]
[im 1/30]
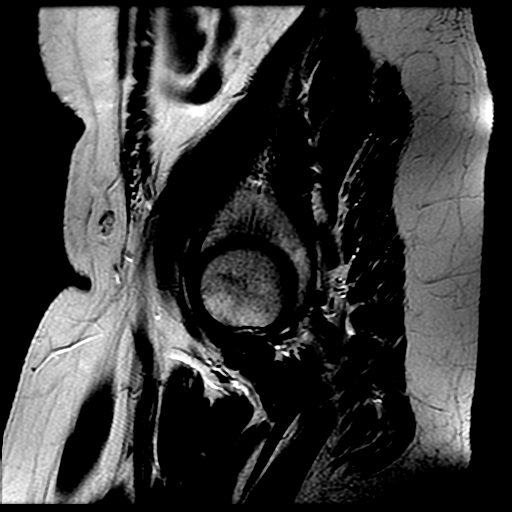
[im 15/30]
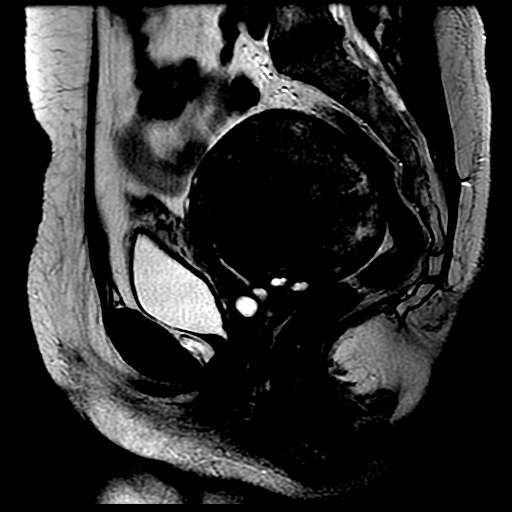
[im 30/30]
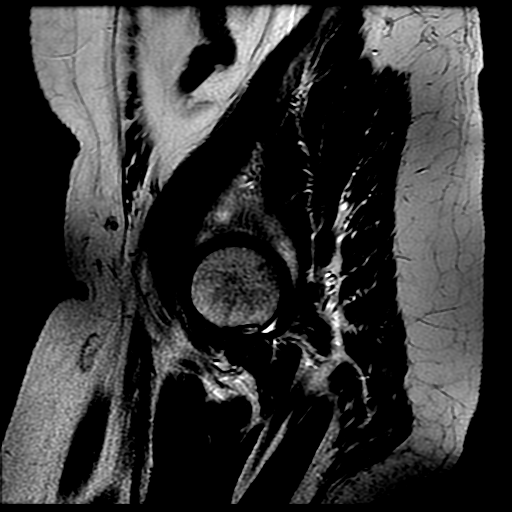

[Series 7: T1 · axial · 5.0mm · 0.47mm/px · z∈[-109,+107]mm · 4 of 37 slices shown]
[im 1/37]
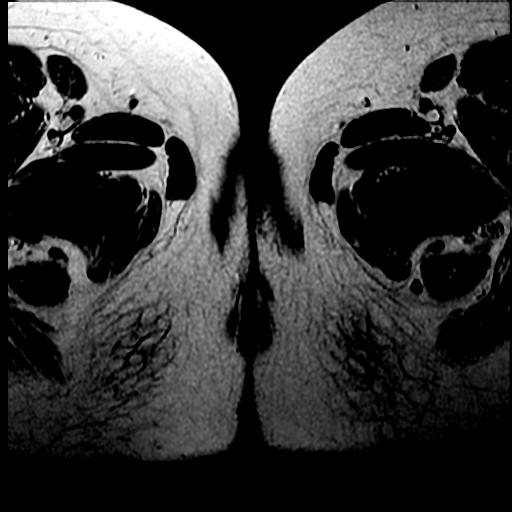
[im 13/37]
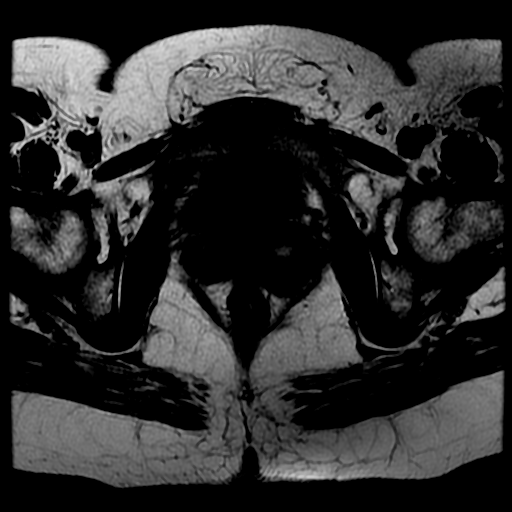
[im 25/37]
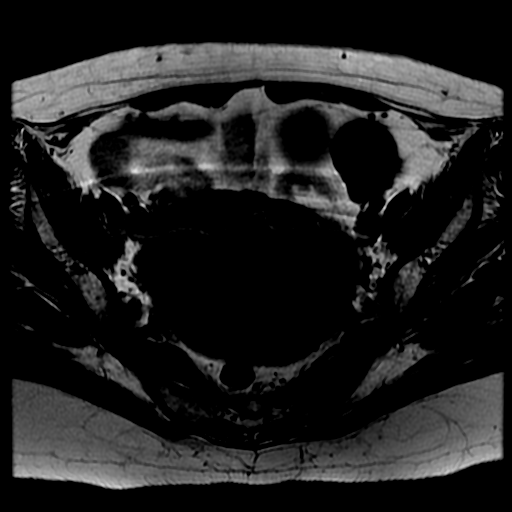
[im 37/37]
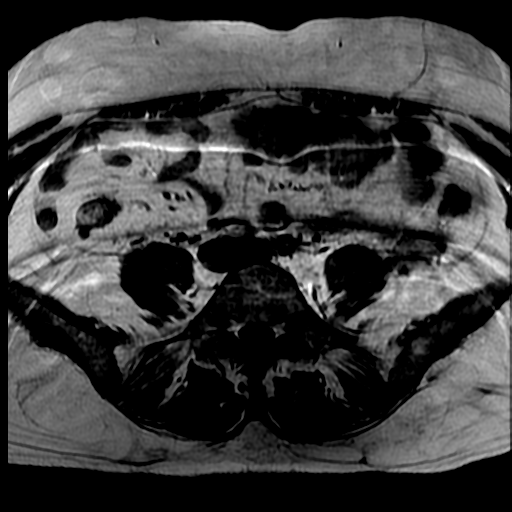

[Series 9: T1 fat-sat post-contrast · sagittal · 5.0mm · 0.47mm/px · 1 of 30 slices shown]
[im 1/30]
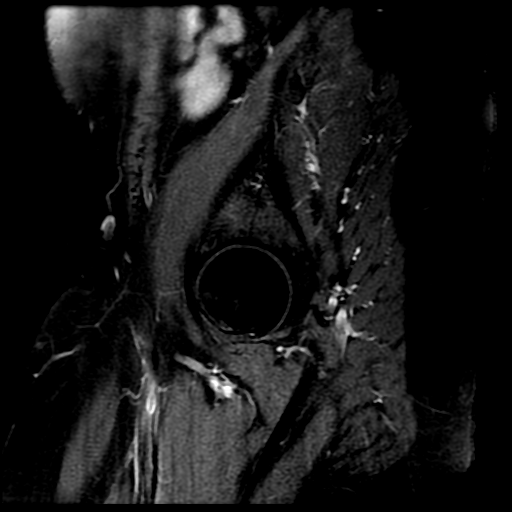

[19 of 48 positions shown; findings below may reference images not displayed]

FINDINGS: Urinary Tract: No bladder abnormality identified.

Bowel:  Unremarkable visualized pelvic bowel loops.

Vascular/Lymphatic: No pathologically enlarged lymph nodes or other
significant abnormality.

Reproductive:

Uterus: Measures 10.7 x 9.6 x 11.4 cm (volume = 610 cm^3). No
significant change compared to previous study. Subserosal fibroid in
the posterior lower uterine segment currently measures 8.9 x 8.3 x
11.3 cm, also not significant changed in size compared to previous
study. This fibroid now shows near complete lack of contrast
enhancement, with some contrast enhancement seen peripherally. No
other fibroids identified. Thickening of myometrial junctional zone
with tiny cystic foci noted in the uterine fundus, consistent with
mild adenomyosis. Multiple cervical nabothian cysts again seen.

Right ovary:  Appears normal.  No mass identified.

Left ovary: 3 cm simple left ovarian follicular cyst noted. No mass
identified.

Other:  None.

Musculoskeletal:  Unremarkable.
IMPRESSION: 11 cm subserosal fibroid in posterior lower uterine segment shows no
significant change in size, but now shows near complete lack of
contrast enhancement consistent with devascularization from recent
embolization.

Uterine adenomyosis noted.

## 2022-09-22 DIAGNOSIS — M533 Sacrococcygeal disorders, not elsewhere classified: Secondary | ICD-10-CM | POA: Diagnosis not present

## 2023-02-26 DIAGNOSIS — Z01419 Encounter for gynecological examination (general) (routine) without abnormal findings: Secondary | ICD-10-CM | POA: Diagnosis not present

## 2023-02-26 DIAGNOSIS — Z1331 Encounter for screening for depression: Secondary | ICD-10-CM | POA: Diagnosis not present

## 2023-02-27 DIAGNOSIS — Z1329 Encounter for screening for other suspected endocrine disorder: Secondary | ICD-10-CM | POA: Diagnosis not present

## 2023-02-27 DIAGNOSIS — Z0000 Encounter for general adult medical examination without abnormal findings: Secondary | ICD-10-CM | POA: Diagnosis not present

## 2023-02-27 DIAGNOSIS — Z131 Encounter for screening for diabetes mellitus: Secondary | ICD-10-CM | POA: Diagnosis not present

## 2023-02-27 DIAGNOSIS — Z13220 Encounter for screening for lipoid disorders: Secondary | ICD-10-CM | POA: Diagnosis not present
